# Patient Record
Sex: Male | Born: 2000 | Race: Black or African American | Marital: Single
Health system: Southern US, Community
[De-identification: ages and names within clinical notes are randomized; demographics above are authoritative.]

## PROBLEM LIST (undated history)

## (undated) DIAGNOSIS — E119 Type 2 diabetes mellitus without complications: Secondary | ICD-10-CM

## (undated) DIAGNOSIS — J45909 Unspecified asthma, uncomplicated: Secondary | ICD-10-CM

## (undated) DIAGNOSIS — R569 Unspecified convulsions: Secondary | ICD-10-CM

## (undated) DIAGNOSIS — M549 Dorsalgia, unspecified: Secondary | ICD-10-CM

## (undated) DIAGNOSIS — M419 Scoliosis, unspecified: Secondary | ICD-10-CM

## (undated) DIAGNOSIS — H539 Unspecified visual disturbance: Secondary | ICD-10-CM

## (undated) DIAGNOSIS — F840 Autistic disorder: Secondary | ICD-10-CM

## (undated) DIAGNOSIS — Q909 Down syndrome, unspecified: Secondary | ICD-10-CM

## (undated) DIAGNOSIS — R413 Other amnesia: Secondary | ICD-10-CM

## (undated) HISTORY — DX: Dorsalgia, unspecified: M54.9

## (undated) HISTORY — DX: Type 2 diabetes mellitus without complications: E11.9

## (undated) HISTORY — DX: Other amnesia: R41.3

## (undated) HISTORY — DX: Unspecified visual disturbance: H53.9

## (undated) HISTORY — DX: Unspecified convulsions: R56.9

---
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
## 2022-01-31 ENCOUNTER — Other Ambulatory Visit (INDEPENDENT_AMBULATORY_CARE_PROVIDER_SITE_OTHER): Payer: Self-pay | Admitting: Neurology

## 2022-03-18 ENCOUNTER — Ambulatory Visit (INDEPENDENT_AMBULATORY_CARE_PROVIDER_SITE_OTHER): Payer: Medicaid Other | Admitting: Neurology

## 2022-03-18 ENCOUNTER — Encounter (INDEPENDENT_AMBULATORY_CARE_PROVIDER_SITE_OTHER): Payer: Self-pay | Admitting: Neurology

## 2022-03-18 VITALS — BP 120/80 | Ht 71.26 in | Wt 247.6 lb

## 2022-03-18 DIAGNOSIS — Q753 Macrocephaly: Secondary | ICD-10-CM

## 2022-03-18 DIAGNOSIS — G40909 Epilepsy, unspecified, not intractable, without status epilepticus: Secondary | ICD-10-CM | POA: Diagnosis not present

## 2022-03-18 DIAGNOSIS — F840 Autistic disorder: Secondary | ICD-10-CM | POA: Diagnosis not present

## 2022-03-18 MED ORDER — CLOBAZAM 10 MG PO TABS: 10.0000 mg | ORAL_TABLET | Freq: Two times a day (BID) | ORAL | 5 refills | Status: DC

## 2022-03-18 MED ORDER — TOPIRAMATE 200 MG PO TABS: 200.0000 mg | ORAL_TABLET | Freq: Two times a day (BID) | ORAL | 7 refills | Status: DC

## 2022-03-18 NOTE — Progress Notes (Signed)
Patient: Stephen Shaw MRN: [id number] ?Sex: male DOB: Mar 20, 2001 ? ?Provider: Keturah Shavers, MD ?Location of Care: [facility] ? ?Note type: Routine return visit ? ?Referral Source: Dr. Ivory Broad ?History from: father and [facility] chart ?Chief Complaint: question on medications ? ?History of Present Illness: ?Stephen Shaw is a 21 y.o. male is here for follow-up management of seizure disorder. ?He has a diagnosis of autism spectrum disorder, developmental delay and intellectual disability with macrocephaly and seizure disorder, currently on Topamax and recently Onfi was added as well due to having more clinical seizure activity. ?Since he was last seen in December and Onfi was added, he has been doing very well without having any other seizure activity and has not had any emergency room visits for seizure. ?He has been tolerating both medications well with no side effects and no drowsiness during the day.  He usually sleeps well through the night.  As per father he has been having some back pain for which he is going to follow-up with his primary care physician.  Overall father is happy with his progress and do not have any other complaints or concerns from neurological point of view. ? ?Review of Systems: ?Review of system as per HPI, otherwise negative. ? ?Past Medical History:  ?Diagnosis Date  ? Asthma   ? Autism   ? Back pain   ? Down syndrome   ? Memory loss   ? Scoliosis   ? Seizures (HCC)   ? Vision abnormalities   ? ?Hospitalizations: No., Head Injury: No., Nervous System Infections: No., Immunizations up to date: Yes.   ? ? ?Surgical History ?History reviewed. No pertinent surgical history. ? ?Family History ?family history includes Heart Problems in an other family member; Seizures in an other family member. ? ? ?Social History ?Social History  ? ?Socioeconomic History  ? Marital status: Single  ?  Spouse name: Not on file  ? Number of children: Not on file  ? Years of  education: Not on file  ? Highest education level: Not on file  ?Occupational History  ? Not on file  ?Tobacco Use  ? Smoking status: Never  ?  Passive exposure: Never  ? Smokeless tobacco: Never  ?Substance and Sexual Activity  ? Alcohol use: No  ? Drug use: No  ? Sexual activity: Not on file  ?Other Topics Concern  ? Not on file  ?Social History Narrative  ? Lives with dad at home. Not in school. Dad stated he graduated.  ? Dad needs a note for land lord to change to a 1 st floor apt due to patients spine issues and seizures.  ? ?Social Determinants of Health  ? ?Financial Resource Strain: Not on file  ?Food Insecurity: Not on file  ?Transportation Needs: Not on file  ?Physical Activity: Not on file  ?Stress: Not on file  ?Social Connections: Not on file  ? ? ? ?Allergies  ?Allergen Reactions  ? Other Itching and Other (See Comments)  ?  Seasonal Allergies (pollen/grasses)- Runny nose, itchy eyes, etc.  ? ? ?Physical Exam ?BP 120/80   Ht 5' 11.26" (1.81 m)   Wt 247 lb 9.2 oz (112.3 kg)   BMI 34.28 kg/m?  ?Gen: Awake, alert, not in distress, Non-toxic appearance. ?Skin: No neurocutaneous stigmata, no rash ?HEENT: Macrocephalic with prominent forehead, no conjunctival injection, nares patent, mucous membranes moist, oropharynx clear. ?Neck: Supple, no meningismus, no lymphadenopathy,  ?Resp: Clear to auscultation bilaterally ?CV: Regular rate, normal S1/S2,  no murmurs, no rubs ?Abd: Bowel sounds present, abdomen soft, non-tender, non-distended.  No hepatosplenomegaly or mass. ?Ext: Warm and well-perfused. No deformity, no muscle wasting, ROM full. ? ?Neurological Examination: ?MS- Awake, alert, interactive, nonverbal but able to follow simple commands ?Cranial Nerves- Pupils equal, round and reactive to light (5 to 6mm); fix and follows with full and smooth EOM; no nystagmus; no ptosis, funduscopy with normal sharp discs, visual field full by looking at the toys on the side, face symmetric with smile.  Hearing  intact to bell bilaterally, palate elevation is symmetric, and tongue protrusion is symmetric. ?Tone- Normal ?Strength-Seems to have good strength, symmetrically by observation and passive movement. ?Reflexes-  ? ? Biceps Triceps Brachioradialis Patellar Ankle  ?R 2+ 2+ 2+ 2+ 2+  ?L 2+ 2+ 2+ 2+ 2+  ? ?Plantar responses flexor bilaterally, no clonus noted ?Sensation- Withdraw at four limbs to stimuli. ?Coordination- Reached to the object with no dysmetria ?Gait: Normal walk without any coordination or balance issues. ? ? ?Assessment and Plan ?1. Seizure disorder ([place])   ?2. Macrocephaly   ?3. Autism spectrum disorder   ? ?This is an almost 21 year old boy with autism spectrum disorder, macrocephaly and seizure disorder, currently on moderate dose of Topamax and Onfi with good seizure control and no recent clinical seizure activity.  He has no new findings on his neurological examination. ?Recommend to continue the same dose of Topamax at 200 mg twice daily ?Continue the same dose of Onfi at 10 mg twice daily ?Continue with adequate sleep and limited screen time ?Follow-up with his PCP for his back pain ?Father will call my office if he develops more seizure activity ?Return in 7 months for follow-up visit to adjust the dose of medication if needed.  Father understood and agreed with the plan. ? ?Meds ordered this encounter  ?Medications  ? topiramate (TOPAMAX) 200 MG tablet  ?  Sig: Take 1 tablet (200 mg total) by mouth 2 (two) times daily.  ?  Dispense:  60 tablet  ?  Refill:  7  ? cloBAZam (ONFI) 10 MG tablet  ?  Sig: Take 1 tablet (10 mg total) by mouth 2 (two) times daily.  ?  Dispense:  60 tablet  ?  Refill:  5  ? ?No orders of the defined types were placed in this encounter. ? ?

## 2022-03-18 NOTE — Patient Instructions (Signed)
Continue the same dose of Topamax and Onfi which would be 1 tablet twice daily of each ?Continue with adequate sleep ?Continue follow-up with primary care physician for his back pain ?Return in 7 months for follow-up visit ?

## 2022-03-20 ENCOUNTER — Ambulatory Visit (INDEPENDENT_AMBULATORY_CARE_PROVIDER_SITE_OTHER): Payer: Medicaid Other | Admitting: Neurology

## 2022-06-15 ENCOUNTER — Emergency Department (HOSPITAL_COMMUNITY)
Admission: EM | Admit: 2022-06-15 | Discharge: 2022-06-15 | Disposition: A | Discharge status: Home / Self Care | Payer: Medicaid Other | Attending: Emergency Medicine | Admitting: Emergency Medicine

## 2022-06-15 ENCOUNTER — Encounter (HOSPITAL_COMMUNITY): Payer: Self-pay

## 2022-06-15 DIAGNOSIS — G40909 Epilepsy, unspecified, not intractable, without status epilepticus: Secondary | ICD-10-CM | POA: Diagnosis present

## 2022-06-15 DIAGNOSIS — R569 Unspecified convulsions: Secondary | ICD-10-CM

## 2022-06-15 LAB — BASIC METABOLIC PANEL
Anion gap: 11 (ref 5–15)
BUN: 11 mg/dL (ref 6–20)
CO2: 15 mmol/L — ABNORMAL LOW (ref 22–32)
Calcium: 9.5 mg/dL (ref 8.9–10.3)
Chloride: 115 mmol/L — ABNORMAL HIGH (ref 98–111)
Creatinine, Ser: 1.35 mg/dL — ABNORMAL HIGH (ref 0.61–1.24)
GFR, Estimated: >60 mL/min (ref >60)
Glucose, Bld: 109 mg/dL — ABNORMAL HIGH (ref 70–99)
Potassium: 4 mmol/L (ref 3.5–5.1)
Sodium: 141 mmol/L (ref 135–145)

## 2022-06-15 LAB — CBC
HCT: 42.2 % (ref 39.0–52.0)
Hemoglobin: 14.5 g/dL (ref 13.0–17.0)
MCH: 30.3 pg (ref 26.0–34.0)
MCHC: 34.4 g/dL (ref 30.0–36.0)
MCV: 88.1 fL (ref 80.0–100.0)
Platelets: 318 10*3/uL (ref 150–400)
RBC: 4.79 MIL/uL (ref 4.22–5.81)
RDW: 12.4 % (ref 11.5–15.5)
WBC: 8 10*3/uL (ref 4.0–10.5)
nRBC: 0 % (ref 0.0–0.2)

## 2022-06-15 MED ORDER — ONDANSETRON HCL 4 MG/2ML IJ SOLN
4.0000 mg | Freq: Once | INTRAMUSCULAR | Status: AC
  Administered 2022-06-15: 4 mg via INTRAVENOUS
  Filled 2022-06-15: qty 2

## 2022-06-15 NOTE — ED Triage Notes (Signed)
Pt BIB [facility] from home after grandma called EMS for seizure lasting >5 mins. Patient was in bed during seizure, no fall, no injury. Patient remained postictal throughout transport, family reports patient is more postictal than usual. Patient vomited en route and EMS reports may have aspirated. Patient alert and oriented to baseline.

## 2022-06-15 NOTE — ED Provider Notes (Signed)
[facility] EMERGENCY DEPARTMENT Provider Note   CSN: [id number] Arrival date & time: 06/15/22  1110     History  Chief Complaint  Patient presents with   Seizures    Stephen Shaw is a 21 y.o. male.  HPI 21 year old male history of known seizure disorder reportedly had seizure this morning.  Father is the historian.  He states that his mother was there.  He had sudden movement of his right hand and then had a grand mal seizure.  Seizure lasted for at least several minutes and patient has been postictal.  No report of missed medications.  Patient has no complaints at this time     Home Medications Prior to Admission medications   Medication Sig Start Date End Date Taking? Authorizing Provider  albuterol (PROVENTIL) (2.5 MG/3ML) 0.083% nebulizer solution Take 2.5 mg by nebulization every 6 (six) hours as needed for wheezing or shortness of breath. For shortness of breath/wheezing.   Yes [provider]  budesonide (PULMICORT) 1 MG/2ML nebulizer solution Take 1 mg by nebulization daily.   Yes [provider]  clindamycin-benzoyl peroxide (BENZACLIN) gel Apply 1 application topically See admin instructions. Apply to affected areas of the face as directed in the evening/wash face 2 times a day 03/16/18  Yes [provider]  cloBAZam (ONFI) 10 MG tablet Take 1 tablet (10 mg total) by mouth 2 (two) times daily. 03/18/22  Yes Keturah Shavers, MD  fluticasone [facility]) 50 MCG/ACT nasal spray Place 2 sprays into both nostrils in the morning.   Yes [provider]  HEALTHYLAX 17 g packet Take 34 g by mouth every 3 (three) days.   Yes [provider]  hydrOXYzine (ATARAX) 10 MG/5ML syrup Take 30 mg by mouth at bedtime. 03/16/18  Yes [provider]  montelukast (SINGULAIR) 5 MG chewable tablet Chew 10 mg by mouth in the morning.   Yes [provider]  naproxen (NAPROSYN) 375 MG tablet Take 375 mg by mouth 2  (two) times daily as needed (for pain). 07/23/21  Yes [provider]  NAYZILAM 5 MG/0.1ML SOLN Apply 5 mg nasally for seizures lasting longer than 5 minutes 11/01/21  Yes Keturah Shavers, MD  [facility] HFA [id number] [phone] Base) MCG/ACT inhaler Inhale 2 puffs into the lungs every 4 (four) hours as needed for wheezing or shortness of breath.   Yes [provider]  SYMBICORT 160-4.5 MCG/ACT inhaler Inhale 2 puffs into the lungs in the morning. 03/16/18  Yes [provider]  topiramate (TOPAMAX) 200 MG tablet Take 1 tablet (200 mg total) by mouth 2 (two) times daily. 03/18/22  Yes Keturah Shavers, MD  VITAMIN D3 SUPER STRENGTH 50 MCG (2000 UT) CAPS Take 2,000 Units by mouth daily.   Yes [provider]  diazepam (DIASTAT ACUDIAL) 10 MG GEL Place 17.5 mg rectally once. Patient not taking: Reported on 08/20/2021 05/12/17 08/20/21  Kirby Crigler, MD  ondansetron (ZOFRAN ODT) 4 MG disintegrating tablet Take 1 tablet (4 mg total) by mouth every 8 (eight) hours as needed for nausea or vomiting. Patient not taking: Reported on 08/20/2021 06/23/18   Juliette Alcide, MD      Allergies    Other    Review of Systems   Review of Systems  Physical Exam Updated Vital Signs BP 133/74   Pulse 88   Resp 17   SpO2 100%  Physical Exam Vitals and nursing note reviewed.  Constitutional:      Appearance: Normal appearance.  HENT:     Head: Normocephalic.     Right Ear: External ear normal.     Left Ear: External ear normal.     Nose: Nose normal.     Mouth/Throat:     Pharynx: Oropharynx is clear.  Eyes:     Pupils: Pupils are equal, round, and reactive to light.  Cardiovascular:     Rate and Rhythm: Normal rate and regular rhythm.     Pulses: Normal pulses.  Pulmonary:     Effort: Pulmonary effort is normal.  Abdominal:     General: Bowel sounds are normal.  Musculoskeletal:        General: Normal range of motion.     Cervical back: Normal range of motion.  Skin:    General:  Skin is warm.     Capillary Refill: Capillary refill takes less than 2 seconds.  Neurological:     General: No focal deficit present.     Mental Status: He is alert.  Psychiatric:        Mood and Affect: Mood normal.     ED Results / Procedures / Treatments   Labs (all labs ordered are listed, but only abnormal results are displayed) Labs Reviewed  BASIC METABOLIC PANEL - Abnormal; Notable for the following components:      Result Value   Chloride 115 (*)    CO2 15 (*)    Glucose, Bld 109 (*)    Creatinine, Ser 1.35 (*)    All other components within normal limits  CBC    EKG None  Radiology No results found.  Procedures Procedures    Medications Ordered in ED Medications  ondansetron (ZOFRAN) injection 4 mg (4 mg Intravenous Given 06/15/22 1209)    ED Course/ Medical Decision Making/ A&P Clinical Course as of 06/15/22 1508  Sat Jun 15, 2022  1502 CBC reviewed interpreted within normal limits [DR]    Clinical Course User Index [DR] Margarita Grizzle, MD                           Medical Decision Making 21 year old male known seizure disorder presents today with seizure.  Per family report patient has been taking medication.  Patient was initially postictal but now appears to be back to baseline. Patient's father is with him. I have discussed necessity of taking medications as prescribed We discussed need for follow-up with neurologist We have discussed return precautions and they voiced understanding  Amount and/or Complexity of Data Reviewed Labs: ordered. Decision-making details documented in ED Course.  Risk Prescription drug management.           Final Clinical Impression(s) / ED Diagnoses Final diagnoses:  Seizure [facility])    Rx / DC Orders ED Discharge Orders     None         Margarita Grizzle, MD 06/15/22 [phone]

## 2022-06-15 NOTE — ED Notes (Signed)
Pt verbalized understanding of d/c instructions, meds, and followup care. Denies questions. VSS, no distress noted. Steady gait to exit with all belongings.  ?

## 2022-06-15 NOTE — Discharge Instructions (Addendum)
Please take all your seizure medications as prescribed Please call your neurologist on Monday for follow-up Per [facility] statutes, patients with seizures are not allowed to drive until they have been seizure-free for six months. Use caution when using heavy equipment or power tools. Avoid working on ladders or at heights. Take showers instead of baths. Ensure the water temperature is not too high on the home water heater. Do not go swimming alone. Do not lock yourself in a room alone (i.e. bathroom). When caring for infants or small children, sit down when holding, feeding, or changing them to minimize risk of injury to the child in the event you have a seizure. Maintain good sleep hygiene. Avoid alcohol.    If Stephen Shaw has another seizure, call 911 and bring them back to the ED if:       A.  The seizure lasts longer than 5 minutes.            B.  The patient doesn't wake shortly after the seizure or has new problems such as difficulty seeing, speaking or moving following the seizure       C.  The patient was injured during the seizure       D.  The patient has a temperature over 102 F (39C)       E.  The patient vomited during the seizure and now is having trouble breathing

## 2022-08-03 ENCOUNTER — Encounter (HOSPITAL_COMMUNITY): Payer: Self-pay | Admitting: *Deleted

## 2022-08-03 ENCOUNTER — Emergency Department (HOSPITAL_COMMUNITY): Payer: Medicaid Other

## 2022-08-03 ENCOUNTER — Other Ambulatory Visit: Payer: Self-pay

## 2022-08-03 ENCOUNTER — Emergency Department (HOSPITAL_COMMUNITY)
Admission: EM | Admit: 2022-08-03 | Discharge: 2022-08-03 | Disposition: A | Discharge status: Home / Self Care | Payer: Medicaid Other | Attending: Emergency Medicine | Admitting: Emergency Medicine

## 2022-08-03 DIAGNOSIS — R569 Unspecified convulsions: Secondary | ICD-10-CM | POA: Diagnosis present

## 2022-08-03 DIAGNOSIS — J45909 Unspecified asthma, uncomplicated: Secondary | ICD-10-CM | POA: Diagnosis not present

## 2022-08-03 LAB — I-STAT CHEM 8, ED
BUN: 11 mg/dL (ref 6–20)
Calcium, Ion: 1.17 mmol/L (ref 1.15–1.40)
Chloride: 112 mmol/L — ABNORMAL HIGH (ref 98–111)
Creatinine, Ser: 1 mg/dL (ref 0.61–1.24)
Glucose, Bld: 99 mg/dL (ref 70–99)
HCT: 39 % (ref 39.0–52.0)
Hemoglobin: 13.3 g/dL (ref 13.0–17.0)
Potassium: 3.3 mmol/L — ABNORMAL LOW (ref 3.5–5.1)
Sodium: 144 mmol/L (ref 135–145)
TCO2: 18 mmol/L — ABNORMAL LOW (ref 22–32)

## 2022-08-03 LAB — CBC
HCT: 44.1 % (ref 39.0–52.0)
Hemoglobin: 14.9 g/dL (ref 13.0–17.0)
MCH: 30.7 pg (ref 26.0–34.0)
MCHC: 33.8 g/dL (ref 30.0–36.0)
MCV: 90.9 fL (ref 80.0–100.0)
Platelets: 313 10*3/uL (ref 150–400)
RBC: 4.85 MIL/uL (ref 4.22–5.81)
RDW: 12.9 % (ref 11.5–15.5)
WBC: 8.5 10*3/uL (ref 4.0–10.5)
nRBC: 0 % (ref 0.0–0.2)

## 2022-08-03 LAB — COMPREHENSIVE METABOLIC PANEL
ALT: 32 U/L (ref 0–44)
AST: 34 U/L (ref 15–41)
Albumin: 4.4 g/dL (ref 3.5–5.0)
Alkaline Phosphatase: 82 U/L (ref 38–126)
Anion gap: 17 — ABNORMAL HIGH (ref 5–15)
BUN: 12 mg/dL (ref 6–20)
CO2: 12 mmol/L — ABNORMAL LOW (ref 22–32)
Calcium: 9.6 mg/dL (ref 8.9–10.3)
Chloride: 111 mmol/L (ref 98–111)
Creatinine, Ser: 1.38 mg/dL — ABNORMAL HIGH (ref 0.61–1.24)
GFR, Estimated: >60 mL/min (ref >60)
Glucose, Bld: 104 mg/dL — ABNORMAL HIGH (ref 70–99)
Potassium: 4 mmol/L (ref 3.5–5.1)
Sodium: 140 mmol/L (ref 135–145)
Total Bilirubin: 0.2 mg/dL — ABNORMAL LOW (ref 0.3–1.2)
Total Protein: 7.6 g/dL (ref 6.5–8.1)

## 2022-08-03 LAB — LACTIC ACID, PLASMA: Lactic Acid, Venous: 8.7 mmol/L (ref 0.5–1.9)

## 2022-08-03 MED ORDER — LACTATED RINGERS IV BOLUS
1000.0000 mL | Freq: Once | INTRAVENOUS | Status: AC
  Administered 2022-08-03: 1000 mL via INTRAVENOUS

## 2022-08-03 MED ORDER — SODIUM CHLORIDE 0.9 % IV BOLUS
1000.0000 mL | Freq: Once | INTRAVENOUS | Status: AC
  Administered 2022-08-03: 1000 mL via INTRAVENOUS

## 2022-08-03 NOTE — ED Provider Notes (Signed)
[facility]-EMERGENCY DEPT [facility] Emergency Department Provider Note MRN:  [id number]  Arrival date & time: 08/03/22     Chief Complaint   Seizures   History of Present Illness   Stephen Shaw is a 21 y.o. year-old male with a history of seizure disorder, Down syndrome, autism presenting to the ED with chief complaint of seizures.  Seizure this evening at about 2 AM, witnessed by father.  Tonic-clonic activity.  Denies any trauma.  A bit slow to return to baseline, had some vomiting when waking up which is abnormal.  Review of Systems  A thorough review of systems was obtained and all systems are negative except as noted in the HPI and PMH.   Patient's Health History    Past Medical History:  Diagnosis Date   Asthma    Autism    Back pain    Down syndrome    Memory loss    Scoliosis    Seizures (HCC)    Vision abnormalities     History reviewed. No pertinent surgical history.  Family History  Problem Relation Age of Onset   Heart Problems Other    Seizures Other     Social History   Socioeconomic History   Marital status: Single    Spouse name: Not on file   Number of children: Not on file   Years of education: Not on file   Highest education level: Not on file  Occupational History   Not on file  Tobacco Use   Smoking status: Never    Passive exposure: Never   Smokeless tobacco: Never  Substance and Sexual Activity   Alcohol use: No   Drug use: No   Sexual activity: Not on file  Other Topics Concern   Not on file  Social History Narrative   Lives with dad at home. Not in school. Dad stated he graduated.   Dad needs a note for land lord to change to a 1 st floor apt due to patients spine issues and seizures.   Social Determinants of Health   Financial Resource Strain: Not on file  Food Insecurity: Not on file  Transportation Needs: Not on file  Physical Activity: Not on file  Stress: Not on file  Social Connections: Not on file   Intimate Partner Violence: Not on file     Physical Exam   Vitals:   08/03/22 0600 08/03/22 0624  BP: 133/78   Pulse: 88   Resp: 20   Temp:  98 F (36.7 C)  SpO2: 100%     CONSTITUTIONAL: Ill-appearing, NAD NEURO/PSYCH: Alert, oriented to name, moves all extremities equally, follows commands EYES:  eyes equal and reactive ENT/NECK:  no LAD, no JVD CARDIO: Regular rate, well-perfused, normal S1 and S2 PULM:  CTAB no wheezing or rhonchi GI/GU:  non-distended, non-tender MSK/SPINE:  No gross deformities, no edema SKIN:  no rash, atraumatic   *Additional and/or pertinent findings included in MDM below  Diagnostic and Interventional Summary    EKG Interpretation  Date/Time:  Saturday August 03 2022 03:22:26 EDT Ventricular Rate:  108 PR Interval:  138 QRS Duration: 111 QT Interval:  342 QTC Calculation: 459 R Axis:   29 Text Interpretation: Sinus tachycardia Consider right ventricular hypertrophy Borderline ST elevation, anterolateral leads Confirmed by Kennis Carina [phone]) on 08/03/2022 4:03:23 AM       Labs Reviewed  COMPREHENSIVE METABOLIC PANEL - Abnormal; Notable for the following components:      Result Value   CO2 12 (*)  Glucose, Bld 104 (*)    Creatinine, Ser 1.38 (*)    Total Bilirubin 0.2 (*)    Anion gap 17 (*)    All other components within normal limits  LACTIC ACID, PLASMA - Abnormal; Notable for the following components:   Lactic Acid, Venous 8.7 (*)    All other components within normal limits  I-STAT CHEM 8, ED - Abnormal; Notable for the following components:   Potassium 3.3 (*)    Chloride 112 (*)    TCO2 18 (*)    All other components within normal limits  CBC    CT HEAD WO CONTRAST ( )  Final Result      Medications  sodium chloride 0.9 % bolus 1,000 mL (0 mLs Intravenous Stopped 08/03/22 0604)  lactated ringers bolus 1,000 mL (0 mLs Intravenous Stopped 08/03/22 0610)     Procedures  /  Critical Care Procedures  ED Course  and Medical Decision Making  Initial Impression and Ddx Seizure disorder presenting with seizure, some atypical features of the seizure, slow to return to baseline and having some vomiting.  Awaiting labs, will obtain CT to exclude bleeding, mass lesion, elevated ICP.  Past medical/surgical history that increases complexity of ED encounter: Seizure disorder  Interpretation of Diagnostics I personally reviewed the EKG and my interpretation is as follows: Sinus rhythm  Labs reveal prominent lactic acidosis, anticipated given the report of seizure.  Patient Reassessment and Ultimate Disposition/Management     Patient is back to baseline, doing well clinically.  Provided with fluids and labs rechecked with significant improvement in acidosis.  Appropriate for discharge.  Patient management required discussion with the following services or consulting groups:  None  Complexity of Problems Addressed Acute illness or injury that poses threat of life of bodily function  Additional Data Reviewed and Analyzed Further history obtained from: Further history from spouse/family member  Additional Factors Impacting ED Encounter Risk None  Elmer Sow. Pilar Plate, MD [facility] Emergency Medicine [facility] [email or web address]  Final Clinical Impressions(s) / ED Diagnoses     ICD-10-CM   1. Seizure (HCC)  R56.9       ED Discharge Orders     None        Discharge Instructions Discussed with and Provided to Patient:     Discharge Instructions      You were evaluated in the Emergency Department and after careful evaluation, we did not find any emergent condition requiring admission or further testing in the hospital.  Your exam/testing today is overall reassuring.  Recommend close follow-up with your neurologist.  Please return to the Emergency Department if you experience any worsening of your condition.   Thank you for allowing [place] to be a part of your care.        Sabas Sous, MD 08/03/22 0630

## 2022-08-03 NOTE — ED Notes (Signed)
Pt father and pt is requesting something to drink for pt. Sent message to provider reference same

## 2022-08-03 NOTE — ED Notes (Signed)
Provided pt with drink at this time. Remains A&O to baseline. Denies any symptoms by pt or father

## 2022-08-03 NOTE — ED Triage Notes (Signed)
Pt arrived with [facility], accompanied by father, for seizures. Hx of same. No recent medication changes, last seizure was in July. While getting checked in, pt vomited ~573ml, denies any pain. Hx of autism. 18g L AC

## 2022-08-03 NOTE — Discharge Instructions (Signed)
You were evaluated in the Emergency Department and after careful evaluation, we did not find any emergent condition requiring admission or further testing in the hospital.  Your exam/testing today is overall reassuring.  Recommend close follow-up with your neurologist.  Please return to the Emergency Department if you experience any worsening of your condition.   Thank you for allowing [place] to be a part of your care.

## 2022-09-23 ENCOUNTER — Other Ambulatory Visit (INDEPENDENT_AMBULATORY_CARE_PROVIDER_SITE_OTHER): Payer: Self-pay | Admitting: Neurology

## 2022-09-23 DIAGNOSIS — G40909 Epilepsy, unspecified, not intractable, without status epilepticus: Secondary | ICD-10-CM

## 2022-09-23 NOTE — Telephone Encounter (Signed)
Seen 03/18/2022 next appt 10/21/22

## 2022-10-21 ENCOUNTER — Ambulatory Visit (INDEPENDENT_AMBULATORY_CARE_PROVIDER_SITE_OTHER): Payer: Medicaid Other | Admitting: Neurology

## 2022-10-21 ENCOUNTER — Encounter (INDEPENDENT_AMBULATORY_CARE_PROVIDER_SITE_OTHER): Payer: Self-pay | Admitting: Neurology

## 2022-10-21 VITALS — BP 122/70 | HR 76 | Ht 71.77 in | Wt 235.9 lb

## 2022-10-21 DIAGNOSIS — R6250 Unspecified lack of expected normal physiological development in childhood: Secondary | ICD-10-CM | POA: Diagnosis not present

## 2022-10-21 DIAGNOSIS — G40909 Epilepsy, unspecified, not intractable, without status epilepticus: Secondary | ICD-10-CM | POA: Diagnosis not present

## 2022-10-21 DIAGNOSIS — Q753 Macrocephaly: Secondary | ICD-10-CM

## 2022-10-21 DIAGNOSIS — F840 Autistic disorder: Secondary | ICD-10-CM

## 2022-10-21 DIAGNOSIS — F819 Developmental disorder of scholastic skills, unspecified: Secondary | ICD-10-CM

## 2022-10-21 MED ORDER — CLOBAZAM 10 MG PO TABS: ORAL_TABLET | ORAL | 5 refills | Status: DC

## 2022-10-21 MED ORDER — TOPIRAMATE 200 MG PO TABS: ORAL_TABLET | ORAL | 7 refills | Status: DC

## 2022-10-21 NOTE — Patient Instructions (Addendum)
Since he has had 2 seizure activity, we will increase the dose of medications Continue Topamax at 1 tablet in a.m. and 1 and half tablet in p.m. Continue Onfi at 1 tablet in a.m. and 1 and half tablet in p.m. Continue with adequate sleep Call my office if there are more seizure activity Try to do some video recording of seizure activity if possible Return in 7 months for follow-up visit

## 2022-10-21 NOTE — Progress Notes (Signed)
Patient: Stephen Shaw MRN: [id number] Sex: male DOB: Oct 29, 2001  Provider: Keturah Shavers, MD Location of Care: [facility] Child Neurology  Note type: Routine return visit  Referral SourceCoccaro, Althea Grimmer, MD  History from: patient and referring office Chief Complaint: Seizure disorder [facility])   History of Present Illness: Stephen Shaw is a 21 y.o. male is here for follow-up management of seizure disorder. He has diagnosis of autism spectrum disorder with developmental delay, intellectual disability, macrocephaly and seizure disorder, currently on 2 AEDs including Topamax and Onfi with moderate dose. He was last seen in April when he was doing fairly well and recommended to continue the same dose of medications but since then he has had 2 episodes of major clinical seizure activity for which he went to the emergency room in July and then in September. Otherwise he has been doing well and has been taking his medication regularly without any missing doses.  He usually sleeps well without any difficulty and with no awakening.  He has no significant behavioral changes or mood issues and father does not have any other complaints or concerns at this time.  Review of Systems: Review of system as per HPI, otherwise negative.  Past Medical History:  Diagnosis Date   Asthma    Autism    Back pain    Down syndrome    Memory loss    Scoliosis    Seizures (HCC)    Vision abnormalities    Hospitalizations: No., Head Injury: No., Nervous System Infections: No., Immunizations up to date: Yes.     Surgical History History reviewed. No pertinent surgical history.  Family History family history includes Heart Problems in an other family member; Seizures in an other family member.  Social History Social History   Socioeconomic History   Marital status: Single    Spouse name: Not on file   Number of children: Not on file   Years of education: Not on file   Highest  education level: Not on file  Occupational History   Not on file  Tobacco Use   Smoking status: Never    Passive exposure: Never   Smokeless tobacco: Never  Substance and Sexual Activity   Alcohol use: No   Drug use: No   Sexual activity: Not on file  Other Topics Concern   Not on file  Social History Narrative   Lives with dad at home. Not in school. Dad stated he graduated.   Dad needs a note for land lord to change to a 1 st floor apt due to patients spine issues and seizures.   Social Determinants of Health   Financial Resource Strain: Not on file  Food Insecurity: Not on file  Transportation Needs: Not on file  Physical Activity: Not on file  Stress: Not on file  Social Connections: Not on file     Allergies  Allergen Reactions   Other Itching and Other (See Comments)    Seasonal Allergies (pollen/grasses)- Runny nose, itchy eyes, etc.    Physical Exam BP 122/70   Pulse 76   Ht 5' 11.77" (1.823 m)   Wt 235 lb 14.3 oz (107 kg)   BMI 32.20 kg/m  Gen: Awake, alert, not in distress,  Skin: No neurocutaneous stigmata, no rash HEENT: Macrocephalic with prominent forehead, no conjunctival injection, nares patent, mucous membranes moist, oropharynx clear. Neck: Supple, no meningismus, no lymphadenopathy,  Resp: Clear to auscultation bilaterally CV: Regular rate, normal S1/S2, no murmurs, no rubs Abd: Bowel sounds  present, abdomen soft, non-tender, non-distended.  No hepatosplenomegaly or mass. Ext: Warm and well-perfused. No deformity, no muscle wasting, ROM full.  Neurological Examination: MS- Awake, decreased eye contact and nonverbal Cranial Nerves- Pupils equal, round and reactive to light (5 to 59mm); fix and follows with full and smooth EOM; no nystagmus; no ptosis, funduscopy with normal sharp discs, visual field full by looking at the toys on the side, face symmetric with smile.  Hearing intact to bell bilaterally, palate elevation is symmetric,  Tone-  Normal Strength-Seems to have good strength, symmetrically by observation and passive movement. Reflexes-    Biceps Triceps Brachioradialis Patellar Ankle  R 2+ 2+ 2+ 2+ 2+  L 2+ 2+ 2+ 2+ 2+   Plantar responses flexor bilaterally, no clonus noted Sensation- Withdraw at four limbs to stimuli. Coordination- Reached to the object with no dysmetria Gait: Normal walk without any coordination or balance issues.   Assessment and Plan 1. Seizure disorder ([place])   2. Macrocephaly   3. Autism spectrum disorder    This is a 21 year old male with diagnosis of autism spectrum disorder, developmental delay, intellectual disability, macrocephaly and seizure disorder, currently on 2 AEDs although he has had at least 2 major clinical seizure activity over the past 6 months.  He has no new findings on his neurological examination. Recommend to slightly increase the dose of Topamax to 200 mg in the morning and 300 mg in the evening Also we will increase the dose of Onfi to 10 mg in the morning and 50 mg in the evening Based on his weight both of these medications would be moderate dose of medication. He will continue with adequate sleep and limited screen time He does have nasal spray in case of prolonged seizure activity I asked father to try to do some video recording of these episodes if possible I would like to see him in 7 months for follow-up visit for reevaluation.  Father understood and agreed with the plan.   Meds ordered this encounter  Medications   topiramate (TOPAMAX) 200 MG tablet    Sig: Take 1 tablet or 200 mg in a.m. and 1.5 tablets or 300 mg in p.m.    Dispense:  75 tablet    Refill:  7   cloBAZam (ONFI) 10 MG tablet    Sig: Take 1 tablet or 10 mg in a.m. and 1.5 tablet or 15 mg in p.m.    Dispense:  75 tablet    Refill:  5   No orders of the defined types were placed in this encounter.

## 2023-04-03 ENCOUNTER — Other Ambulatory Visit (INDEPENDENT_AMBULATORY_CARE_PROVIDER_SITE_OTHER): Payer: Self-pay | Admitting: Neurology

## 2023-04-03 DIAGNOSIS — G40909 Epilepsy, unspecified, not intractable, without status epilepticus: Secondary | ICD-10-CM

## 2023-04-03 NOTE — Telephone Encounter (Signed)
Last OV 10/21/22 Next OV 05/22/23 Rx written 10/21/22 with 5 RF

## 2023-04-29 ENCOUNTER — Telehealth (INDEPENDENT_AMBULATORY_CARE_PROVIDER_SITE_OTHER): Payer: Self-pay | Admitting: Neurology

## 2023-04-29 MED ORDER — TOPIRAMATE 200 MG PO TABS
ORAL_TABLET | ORAL | 7 refills | Status: DC
Start: 2023-04-29 — End: 2023-05-22

## 2023-04-29 NOTE — Telephone Encounter (Signed)
Who's calling (name and relationship to patient) : Barbaraann Cao; dad   Best contact number: [phone]  Provider they see: Dr. Merri Brunette  Reason for call: Dad has called in stating that [facility] Sacks has ran out of meds for Topamax. Dad stated that he was getting 6, but he was taken down to 75. Dad stated this is the second time that he has ran out of meds. Dad has requested a call back.   Call ID:      PRESCRIPTION REFILL ONLY  Name of prescription:  Pharmacy:

## 2023-04-29 NOTE — Telephone Encounter (Signed)
Returned call to dad. He states the Topomax Rx "should be a quantity of 90 and not 75 and this is why he keeps running out".   After reviewing last note/office visit (10-21-2022) I see 1 in AM and 1 & 1/2 in PM. Dad states that you both talked about doing 1 & 1/2 in AM and 1 % 1/2 in PM.  Can you advise?  Lucille Passy CMA

## 2023-04-29 NOTE — Telephone Encounter (Signed)
Dad is stating that you cut his medication down and states he was told to give him 1.5 morning and night. (200 mg) and he said the same for the smaller one also. He is asking can you re write the RX for the pharmacy so that way it can be filled accordingly. Dad doesn't want him to run out.

## 2023-04-30 NOTE — Telephone Encounter (Signed)
Returned call to dad.  Rx sent is (Topomax) with Cloretta Ned: 90 with updated directions and refills.  Confirmed with pharmacy.  B. Roten CMA

## 2023-04-30 NOTE — Telephone Encounter (Signed)
Dad called back in stating that the quantity of 75 is not enough and he needs to go back up to 90 due to [employer] running out.

## 2023-04-30 NOTE — Telephone Encounter (Signed)
Notified Father/Guardian.  B. Roten CMA

## 2023-05-21 NOTE — Progress Notes (Unsigned)
Patient: Stephen Shaw MRN: [id number] Sex: male DOB: [birth year]  Provider: Keturah Shavers, MD Location of Care: [facility]  Note type: {CN NOTE WUJWJ:[id number]}  Referral Source: Christel Mormon, MD History from: {CN REFERRED AO:[id number]} Chief Complaint: Follow up Seizures, Macrocephaly & Autism  History of Present Illness:  Stephen Shaw is a 22 y.o. male ***.  Review of Systems: Review of system as per HPI, otherwise negative.  Past Medical History:  Diagnosis Date   Asthma    Autism    Back pain    Down syndrome    Memory loss    Scoliosis    Seizures (HCC)    Vision abnormalities    Hospitalizations: {yes no:314532}, Head Injury: {yes no:314532}, Nervous System Infections: {yes no:314532}, Immunizations up to date: {yes no:314532}  Birth History ***  Surgical History No past surgical history on file.  Family History family history includes Heart Problems in an other family member; Seizures in an other family member. Family History is negative for ***.  Social History Social History   Socioeconomic History   Marital status: Single    Spouse name: Not on file   Number of children: Not on file   Years of education: Not on file   Highest education level: Not on file  Occupational History   Not on file  Tobacco Use   Smoking status: Never    Passive exposure: Never   Smokeless tobacco: Never  Substance and Sexual Activity   Alcohol use: No   Drug use: No   Sexual activity: Not on file  Other Topics Concern   Not on file  Social History Narrative   Lives with dad at home. Not in school. Dad stated he graduated.   Dad needs a note for land lord to change to a 1 st floor apt due to patients spine issues and seizures.   Social Determinants of Health   Financial Resource Strain: Not on file  Food Insecurity: Not on file  Transportation Needs: Not on file  Physical Activity: Not on file  Stress: Not on file   Social Connections: Not on file     Allergies  Allergen Reactions   Other Itching and Other (See Comments)    Seasonal Allergies (pollen/grasses)- Runny nose, itchy eyes, etc.    Physical Exam There were no vitals taken for this visit. ***  Assessment and Plan ***  No orders of the defined types were placed in this encounter.  No orders of the defined types were placed in this encounter.

## 2023-05-22 ENCOUNTER — Ambulatory Visit (INDEPENDENT_AMBULATORY_CARE_PROVIDER_SITE_OTHER): Payer: Medicaid Other | Admitting: Neurology

## 2023-05-22 ENCOUNTER — Encounter (INDEPENDENT_AMBULATORY_CARE_PROVIDER_SITE_OTHER): Payer: Self-pay | Admitting: Neurology

## 2023-05-22 VITALS — BP 108/70 | HR 80 | Ht 71.85 in | Wt 222.4 lb

## 2023-05-22 DIAGNOSIS — G40909 Epilepsy, unspecified, not intractable, without status epilepticus: Secondary | ICD-10-CM

## 2023-05-22 DIAGNOSIS — F840 Autistic disorder: Secondary | ICD-10-CM

## 2023-05-22 DIAGNOSIS — Q753 Macrocephaly: Secondary | ICD-10-CM | POA: Diagnosis not present

## 2023-05-22 MED ORDER — CLOBAZAM 10 MG PO TABS: ORAL_TABLET | ORAL | 5 refills | Status: DC

## 2023-05-22 MED ORDER — TOPIRAMATE 200 MG PO TABS: ORAL_TABLET | ORAL | 7 refills | Status: DC

## 2023-05-22 NOTE — Patient Instructions (Signed)
Continue the same dose of Topamax at 1.5 tablet twice daily Continue the same dose of Onfi at 1.5 tablet twice daily Continue with adequate sleep Have more hydration Call my office if there is any seizure Return in 7 months for follow-up with

## 2023-07-11 ENCOUNTER — Emergency Department (HOSPITAL_COMMUNITY): Payer: MEDICAID

## 2023-07-11 ENCOUNTER — Emergency Department (HOSPITAL_COMMUNITY)
Admission: EM | Admit: 2023-07-11 | Discharge: 2023-07-11 | Disposition: A | Discharge status: Home / Self Care | Payer: MEDICAID | Attending: Emergency Medicine | Admitting: Emergency Medicine

## 2023-07-11 ENCOUNTER — Encounter (HOSPITAL_COMMUNITY): Payer: Self-pay | Admitting: Emergency Medicine

## 2023-07-11 ENCOUNTER — Other Ambulatory Visit: Payer: Self-pay

## 2023-07-11 DIAGNOSIS — R569 Unspecified convulsions: Secondary | ICD-10-CM | POA: Diagnosis present

## 2023-07-11 DIAGNOSIS — E1110 Type 2 diabetes mellitus with ketoacidosis without coma: Secondary | ICD-10-CM | POA: Diagnosis not present

## 2023-07-11 DIAGNOSIS — F840 Autistic disorder: Secondary | ICD-10-CM | POA: Diagnosis not present

## 2023-07-11 DIAGNOSIS — Z7984 Long term (current) use of oral hypoglycemic drugs: Secondary | ICD-10-CM | POA: Diagnosis not present

## 2023-07-11 DIAGNOSIS — Z79899 Other long term (current) drug therapy: Secondary | ICD-10-CM | POA: Insufficient documentation

## 2023-07-11 LAB — BASIC METABOLIC PANEL
Anion gap: 11 (ref 5–15)
BUN: 10 mg/dL (ref 6–20)
CO2: 20 mmol/L — ABNORMAL LOW (ref 22–32)
Calcium: 8.7 mg/dL — ABNORMAL LOW (ref 8.9–10.3)
Chloride: 108 mmol/L (ref 98–111)
Creatinine, Ser: 1.04 mg/dL (ref 0.61–1.24)
GFR, Estimated: >60 mL/min (ref >60)
Glucose, Bld: 102 mg/dL — ABNORMAL HIGH (ref 70–99)
Potassium: 3.2 mmol/L — ABNORMAL LOW (ref 3.5–5.1)
Sodium: 139 mmol/L (ref 135–145)

## 2023-07-11 LAB — ETHANOL: Alcohol, Ethyl (B): <10 mg/dL (ref <10)

## 2023-07-11 LAB — COMPREHENSIVE METABOLIC PANEL
ALT: 24 U/L (ref 0–44)
AST: 26 U/L (ref 15–41)
Albumin: 4.3 g/dL (ref 3.5–5.0)
Alkaline Phosphatase: 69 U/L (ref 38–126)
Anion gap: 17 — ABNORMAL HIGH (ref 5–15)
BUN: 10 mg/dL (ref 6–20)
CO2: 13 mmol/L — ABNORMAL LOW (ref 22–32)
Calcium: 9.5 mg/dL (ref 8.9–10.3)
Chloride: 107 mmol/L (ref 98–111)
Creatinine, Ser: 1.22 mg/dL (ref 0.61–1.24)
GFR, Estimated: >60 mL/min (ref >60)
Glucose, Bld: 105 mg/dL — ABNORMAL HIGH (ref 70–99)
Potassium: 3.8 mmol/L (ref 3.5–5.1)
Sodium: 137 mmol/L (ref 135–145)
Total Bilirubin: <0.1 mg/dL — ABNORMAL LOW (ref 0.3–1.2)
Total Protein: 7.8 g/dL (ref 6.5–8.1)

## 2023-07-11 LAB — CBC WITH DIFFERENTIAL/PLATELET
Abs Immature Granulocytes: 0.09 10*3/uL — ABNORMAL HIGH (ref 0.00–0.07)
Basophils Absolute: 0.1 10*3/uL (ref 0.0–0.1)
Basophils Relative: 1 %
Eosinophils Absolute: 0.2 10*3/uL (ref 0.0–0.5)
Eosinophils Relative: 2 %
HCT: 45 % (ref 39.0–52.0)
Hemoglobin: 14.8 g/dL (ref 13.0–17.0)
Immature Granulocytes: 1 %
Lymphocytes Relative: 48 %
Lymphs Abs: 6 10*3/uL — ABNORMAL HIGH (ref 0.7–4.0)
MCH: 29.6 pg (ref 26.0–34.0)
MCHC: 32.9 g/dL (ref 30.0–36.0)
MCV: 90 fL (ref 80.0–100.0)
Monocytes Absolute: 1.1 10*3/uL — ABNORMAL HIGH (ref 0.1–1.0)
Monocytes Relative: 9 %
Neutro Abs: 4.7 10*3/uL (ref 1.7–7.7)
Neutrophils Relative %: 39 %
Platelets: 315 10*3/uL (ref 150–400)
RBC: 5 MIL/uL (ref 4.22–5.81)
RDW: 12.8 % (ref 11.5–15.5)
WBC: 12.1 10*3/uL — ABNORMAL HIGH (ref 4.0–10.5)
nRBC: 0 % (ref 0.0–0.2)

## 2023-07-11 LAB — CBG MONITORING, ED: Glucose-Capillary: 99 mg/dL (ref 70–99)

## 2023-07-11 MED ORDER — POTASSIUM CHLORIDE CRYS ER 20 MEQ PO TBCR
40.0000 meq | EXTENDED_RELEASE_TABLET | Freq: Once | ORAL | Status: AC
  Administered 2023-07-11: 40 meq via ORAL
  Filled 2023-07-11: qty 2

## 2023-07-11 MED ORDER — LACTATED RINGERS IV BOLUS
1000.0000 mL | Freq: Once | INTRAVENOUS | Status: AC
  Administered 2023-07-11: 1000 mL via INTRAVENOUS

## 2023-07-11 NOTE — ED Provider Notes (Signed)
[place] EMERGENCY DEPARTMENT AT [facility] Provider Note   CSN: [id number] Arrival date & time: 07/11/23  0145     History  Chief Complaint  Patient presents with   Seizures    Stephen Shaw is a 22 y.o. male.  Patient presents after seizure.  Does have a history of seizure disorder, autism, Down syndrome, diabetes.  Father witnessed tonic-clonic activity in bed tonight.  Lasted about 2 to 3 minutes and resolved without emergent medications.  Back to baseline now.  Having some vomiting which is not a typical after seizures.  Last seizure was approximately September 2023.  Father states compliance with seizure medications which include clobazam and Topamax.  No recent medication changes.  No fevers, chills, nausea or vomiting prior to this episode.  No chest pain or shortness of breath.  No abdominal pain.  No tongue biting or incontinence.  No alcohol or drug use  The history is provided by the patient, a relative and a parent.  Seizures      Home Medications Prior to Admission medications   Medication Sig Start Date End Date Taking? Authorizing Provider  albuterol (PROVENTIL) (2.5 MG/3ML) 0.083% nebulizer solution Take 2.5 mg by nebulization every 6 (six) hours as needed for wheezing or shortness of breath. For shortness of breath/wheezing.    [provider]  budesonide (PULMICORT) 1 MG/2ML nebulizer solution Take 1 mg by nebulization daily.    [provider]  CETIRIZINE HCL CHILDRENS ALRGY 1 MG/ML SOLN Take 10 mLs by mouth daily. 05/15/23   [provider]  clindamycin-benzoyl peroxide (BENZACLIN) gel Apply 1 application topically See admin instructions. Apply to affected areas of the face as directed in the evening/wash face 2 times a day 03/16/18   [provider]  cloBAZam (ONFI) 10 MG tablet 1 and 1/2 in AM and 1 and 1/2 in PM (night) 05/22/23   Keturah Shavers, MD  fluticasone [facility]) 50 MCG/ACT nasal spray Place 2  sprays into both nostrils in the morning.    [provider]  HEALTHYLAX 17 g packet Take 34 g by mouth every 3 (three) days. Patient not taking: Reported on 05/22/2023    [provider]  hydrOXYzine (ATARAX) 10 MG/5ML syrup Take 30 mg by mouth at bedtime. 03/16/18   [provider]  metFORMIN (GLUCOPHAGE) 500 MG tablet Take 500 mg by mouth 2 (two) times daily with a meal. 05/01/23   [provider]  montelukast (SINGULAIR) 5 MG chewable tablet Chew 10 mg by mouth in the morning.    [provider]  naproxen (NAPROSYN) 375 MG tablet Take 375 mg by mouth 2 (two) times daily as needed (for pain). 07/23/21   [provider]  NAYZILAM 5 MG/0.1ML SOLN Apply 5 mg nasally for seizures lasting longer than 5 minutes 11/01/21   Keturah Shavers, MD  ondansetron (ZOFRAN ODT) 4 MG disintegrating tablet Take 1 tablet (4 mg total) by mouth every 8 (eight) hours as needed for nausea or vomiting. 06/23/18   Juliette Alcide, MD  PROAIR HFA 108 [phone] Base) MCG/ACT inhaler Inhale 2 puffs into the lungs every 4 (four) hours as needed for wheezing or shortness of breath.    [provider]  SYMBICORT 160-4.5 MCG/ACT inhaler Inhale 2 puffs into the lungs in the morning. 03/16/18   [provider]  topiramate (TOPAMAX) 200 MG tablet Take 1.5 tablets or 300 mg twice daily 05/22/23   Keturah Shavers, MD  VITAMIN D3 SUPER STRENGTH 50  MCG (2000 UT) CAPS Take 2,000 Units by mouth daily.    [provider]      Allergies    American cockroach, Other, and Timothy grass pollen allergen    Review of Systems   Review of Systems  Unable to perform ROS: Mental status change  Neurological:  Positive for seizures.    Physical Exam Updated Vital Signs BP (!) 140/83 (BP Location: Right Arm)   Pulse (!) 122   Temp 98.8 F (37.1 C) (Axillary)   Resp 17   Ht 6' (1.829 m)   Wt 98.4 kg   SpO2 100%   BMI 29.43 kg/m  Physical Exam Vitals and nursing note  reviewed.  Constitutional:      General: He is not in acute distress.    Appearance: He is well-developed.  HENT:     Head: Normocephalic and atraumatic.     Mouth/Throat:     Pharynx: No oropharyngeal exudate.  Eyes:     Conjunctiva/sclera: Conjunctivae normal.     Pupils: Pupils are equal, round, and reactive to light.  Neck:     Comments: No meningismus. Cardiovascular:     Rate and Rhythm: Regular rhythm. Tachycardia present.     Heart sounds: Normal heart sounds. No murmur heard. Pulmonary:     Effort: Pulmonary effort is normal. No respiratory distress.     Breath sounds: Normal breath sounds.  Abdominal:     Palpations: Abdomen is soft.     Tenderness: There is no abdominal tenderness. There is no guarding or rebound.  Musculoskeletal:        General: No tenderness. Normal range of motion.     Cervical back: Normal range of motion and neck supple.  Skin:    General: Skin is warm.  Neurological:     Mental Status: He is alert.     Cranial Nerves: No cranial nerve deficit.     Motor: No abnormal muscle tone.     Coordination: Coordination normal.     Comments:  Oriented to person and place.  No tongue biting.  No incontinence.  5/5 strength throughout.  Cranial nerves II to XII intact.  Tongue is midline.   Psychiatric:        Behavior: Behavior normal.     ED Results / Procedures / Treatments   Labs (all labs ordered are listed, but only abnormal results are displayed) Labs Reviewed  COMPREHENSIVE METABOLIC PANEL - Abnormal; Notable for the following components:      Result Value   CO2 13 (*)    Glucose, Bld 105 (*)    Total Bilirubin <0.1 (*)    Anion gap 17 (*)    All other components within normal limits  CBC WITH DIFFERENTIAL/PLATELET - Abnormal; Notable for the following components:   WBC 12.1 (*)    Lymphs Abs 6.0 (*)    Monocytes Absolute 1.1 (*)    Abs Immature Granulocytes 0.09 (*)    All other components within normal limits  BASIC METABOLIC  PANEL - Abnormal; Notable for the following components:   Potassium 3.2 (*)    CO2 20 (*)    Glucose, Bld 102 (*)    Calcium 8.7 (*)    All other components within normal limits  ETHANOL  RAPID URINE DRUG SCREEN, HOSP PERFORMED  URINALYSIS, ROUTINE W REFLEX MICROSCOPIC  CBG MONITORING, ED    EKG None  Radiology CT Head Wo Contrast  Result Date: 07/11/2023 CLINICAL DATA:  Seizure disorder EXAM: CT HEAD  WITHOUT CONTRAST TECHNIQUE: Contiguous axial images were obtained from the base of the skull through the vertex without intravenous contrast. RADIATION DOSE REDUCTION: This exam was performed according to the departmental dose-optimization program which includes automated exposure control, adjustment of the mA and/or kV according to patient size and/or use of iterative reconstruction technique. COMPARISON:  08/03/2022 FINDINGS: Brain: There is no mass, hemorrhage or extra-axial collection. The size and configuration of the ventricles and extra-axial CSF spaces are normal. The brain parenchyma is normal, without acute or chronic infarction. Vascular: No abnormal hyperdensity of the major intracranial arteries or dural venous sinuses. No intracranial atherosclerosis. Skull: The visualized skull base, calvarium and extracranial soft tissues are normal. Sinuses/Orbits: No fluid levels or advanced mucosal thickening of the visualized paranasal sinuses. No mastoid or middle ear effusion. The orbits are normal. IMPRESSION: Normal head CT. Electronically Signed   By: Deatra Robinson M.D.   On: 07/11/2023 03:20    Procedures Procedures    Medications Ordered in ED Medications  lactated ringers bolus 1,000 mL (1,000 mLs Intravenous New Bag/Given 07/11/23 0211)    ED Course/ Medical Decision Making/ A&P                                 Medical Decision Making Amount and/or Complexity of Data Reviewed Independent Historian: guardian Labs: ordered. Decision-making details documented in ED  Course. Radiology: ordered and independent interpretation performed. Decision-making details documented in ED Course. ECG/medicine tests: ordered and independent interpretation performed. Decision-making details documented in ED Course.  Risk Prescription drug management.   Seizure with known seizure history.  Compliant with medications.  Patient with vomiting now which is not atypical for him after a seizure.  Vitals are stable.  Somewhat tachycardic.  No fever.  No recent infectious symptoms.  Patient given IV fluids.  Labs obtained.  Mild acidosis as expected after seizure  CT head shows no acute findings.  Patient does not have a VP shunt.  Acidosis has improved.  Father confirms patient is back to baseline  D/w Dr. Sheppard Penton of pediatric neurology covering for Dr. Merri Brunette.  She recommends increasing Onfi to 15 mg in the morning and 20 mg at night.  Continue 300 mg of Topamax twice daily  Follow-up with his neurologist for recheck this week. Return to the ED with new or worsening symptoms.       Final Clinical Impression(s) / ED Diagnoses Final diagnoses:  Seizures [facility])    Rx / DC Orders ED Discharge Orders     None         Kazuma Elena, Jeannett Senior, MD 07/11/23 [phone]

## 2023-07-11 NOTE — Discharge Instructions (Signed)
Increase your clobazam to 15 mg in the morning and 20 mg at night. Do not drive or operate heavy machinery. Continue your Topamax as prescribed.  Call your neurologist for an appointment this week.  Return to the ED with new or worsening symptoms.

## 2023-07-11 NOTE — ED Triage Notes (Signed)
Patient BIB [facility] c/o seizure that last approx 2 mins PTA.  Patient has history of same, last seizure a year ago.  Patient compliant with all meds.  Patient at baseline currently.  20 G L AC 105/60 105 HR 98% RA CBG 99

## 2023-10-16 ENCOUNTER — Telehealth (INDEPENDENT_AMBULATORY_CARE_PROVIDER_SITE_OTHER): Payer: Self-pay | Admitting: Neurology

## 2023-10-16 DIAGNOSIS — G40909 Epilepsy, unspecified, not intractable, without status epilepticus: Secondary | ICD-10-CM

## 2023-10-16 MED ORDER — TOPIRAMATE 200 MG PO TABS: ORAL_TABLET | ORAL | 7 refills | Status: DC

## 2023-10-16 MED ORDER — CLOBAZAM 10 MG PO TABS
ORAL_TABLET | ORAL | 5 refills | Status: DC
Start: 2023-10-16 — End: 2023-12-22

## 2023-10-16 NOTE — Telephone Encounter (Signed)
Stephen Shaw, MD23 minutes ago (4:08 PM)   Please call father and tell him to continue Topamax at the same dose of 300 mg twice daily or 1.5 tablet twice daily And continue Onfi at 20 mg twice daily which would be 2 tablets twice daily until the next appointment at the end of January. I sent in a prescription for both of these medications.    Call back to dad and advised. He may have enough med to last until next refill by decreasing it by 1 tab a day if not let us know

## 2023-10-16 NOTE — Telephone Encounter (Signed)
Call to pharm. They are dispensing 90 tabs of the 200 mg Topamax. Pharm questions if the tablet is crumbling and are using more.  Call to dad- He reports when he went to the hospital with seizures they told him to give 400 mg in AM and 400 mg in the PM. That is why he is running short. RN will route message to provider to re-order medication. RN sees the Onfi dose was changed but does not see the Topamax change in the notes. D/w Dr. Sheppard Penton of pediatric neurology covering for Dr. Merri Brunette.  She recommends increasing Onfi to 15 mg in the morning and 20 mg at night.  Continue 300 mg of Topamax twice daily   Call back to dad to determine how much Onfi he is giving. He reports 2 in AM and 2 in PM but is not running short on it. RN read note above to him and advised will ask Dr. Devonne Doughty to determine the dose he wants him to take and to send in new rx's for them. Dad agrees with plan.

## 2023-10-16 NOTE — Telephone Encounter (Signed)
Please call father and tell him to continue Topamax at the same dose of 300 mg twice daily or 1.5 tablet twice daily And continue Onfi at 20 mg twice daily which would be 2 tablets twice daily until the next appointment at the end of January. I sent in a prescription for both of these medications.

## 2023-10-16 NOTE — Telephone Encounter (Signed)
  Name of who is calling: Barbaraann Cao   Caller's Relationship to Patient: dad  Best contact number: [phone]  Provider they see: Nab   Reason for call: dad called regarding refill on seizure medication, he says that he seems to always run short a week before he is supposed to get his next refill (next Friday). He says he only has about 4 days worth left and he would like to know if that can be refilled in advance.      PRESCRIPTION REFILL ONLY  Name of prescription: Topamax   Pharmacy: [facility] [address] Ginette Otto [place]

## 2023-12-22 ENCOUNTER — Ambulatory Visit (INDEPENDENT_AMBULATORY_CARE_PROVIDER_SITE_OTHER): Payer: MEDICAID | Admitting: Neurology

## 2023-12-22 ENCOUNTER — Encounter (INDEPENDENT_AMBULATORY_CARE_PROVIDER_SITE_OTHER): Payer: Self-pay | Admitting: Neurology

## 2023-12-22 VITALS — BP 126/70 | HR 62 | Ht 72.09 in | Wt 213.2 lb

## 2023-12-22 DIAGNOSIS — G40909 Epilepsy, unspecified, not intractable, without status epilepticus: Secondary | ICD-10-CM | POA: Diagnosis not present

## 2023-12-22 DIAGNOSIS — Q753 Macrocephaly: Secondary | ICD-10-CM

## 2023-12-22 DIAGNOSIS — F840 Autistic disorder: Secondary | ICD-10-CM

## 2023-12-22 MED ORDER — TOPIRAMATE 200 MG PO TABS: ORAL_TABLET | ORAL | 9 refills | Status: DC

## 2023-12-22 MED ORDER — CLOBAZAM 10 MG PO TABS
ORAL_TABLET | ORAL | 5 refills | Status: DC
Start: 2023-12-22 — End: 2024-06-21

## 2023-12-22 NOTE — Patient Instructions (Signed)
Continue Topamax at the same dose of 1.5 tablet 2 times a day or 1 tablet in the morning and 2 tablet at night Continue Onfi with 2 tablets twice daily Continue with adequate sleep and limited screen time Have nasal spray in case of prolonged seizure activity Return in 9 months for follow-up visit

## 2023-12-22 NOTE — Progress Notes (Signed)
Patient: Stephen Shaw MRN: [id number] Sex: male DOB: February 08, 2001  Provider: Keturah Shavers, MD Location of Care: [facility]  Note type: Routine return visit  Referral Source: Christel Mormon, MD History from: patient, [facility] chart, and Dad Chief Complaint: Seizures   History of Present Illness: Stephen Shaw is a 23 y.o. male is here for follow-up management of seizure disorder with 1 breakthrough seizure a few months ago. He has a diagnosis of autism spectrum disorder, microcephaly, developmental delay/intellectual disability and clinical seizure disorder but with normal EEGs, currently on 2 AEDs including Topamax and Onfi with fairly good seizure control and no side effects although he had 1 episode of clinical seizure activity after his last visit in June which happened on 07/11/2023 for which he was seen in the emergency room and the dose of medications increased. On his last visit, he was recommended to continue with Topamax 300 mg twice daily and Onfi 15 mg twice daily and then the dose of Onfi increased to 20 mg twice daily and recommended to follow-up in a few months. In August when he had an episode of clinical seizure activity, the dose of both medications increased with Topamax at 400 mg twice daily and Onfi at 20 mg twice daily but after a couple of months further decrease the dose of medication back to Topamax 300 mg twice daily and Onfi 15 mg twice daily which is the current dose that he is taking. Over the past 5 months he has not had any clinical seizure activity and he has been taking the medication regularly without any missing doses.  He usually sleeps well without any difficulty.  He has not had any change in behavior and father does not have any other complaints or concerns at this time. He does have nasal spray as a rescue medication in case of prolonged seizure activity.  He has no other symptoms with no evidence of increased ICP such as  headache, vomiting or abnormal eye movements.  Review of Systems: Review of system as per HPI, otherwise negative.  Past Medical History:  Diagnosis Date   Asthma    Autism    Back pain    Down syndrome    Memory loss    Scoliosis    Seizures (HCC)    Type 2 diabetes mellitus (HCC)    Vision abnormalities    Hospitalizations: No., Head Injury: No., Nervous System Infections: No., Immunizations up to date: Yes.     Surgical History History reviewed. No pertinent surgical history.  Family History family history includes Heart Problems in an other family member; Seizures in an other family member.   Social History  Social History Narrative   Lives with dad at home. Not in school. Dad stated he graduated.   Dad needs a note for land lord to change to a 1 st floor apt due to patients spine issues and seizures.   Social Drivers of Corporate investment banker Strain: Not at Risk (01/06/2023)   Received from [place], [place]   [employer]    Financial Resource Strain: 1  Food Insecurity: At Risk (01/06/2023)   [place], [place]   Food Insecurity    Food: 2  Transportation Needs: Not at Risk (01/06/2023)   Received from [place], [employer] Needs    Transportation: 1  Physical Activity: Not on File (03/21/2022)   Received from [place], [place]   Physical Activity    Physical Activity: 0  Stress: Not on File (03/21/2022)   Received from [facility], [place]   Stress    Stress: 0  Social Connections: Not on File (08/07/2023)   Received from [facility]   Social Connections    Connectedness: 0     Allergies  Allergen Reactions   American Cockroach    Other Itching and Other (See Comments)    Tree's Box Elder   Timothy Grass Pollen Allergen Itching    Physical Exam BP 126/70   Pulse 62   Ht 6' 0.09" (1.831 m)   Wt 213 lb 3 oz (96.7 kg)   BMI 28.84 kg/m  Gen: Awake, alert, not in distress, Non-toxic appearance. Skin: No neurocutaneous stigmata,  no rash HEENT: Macrocephalic with some frontal bossing, no conjunctival injection, nares patent, mucous membranes moist, oropharynx clear. Neck: Supple, no meningismus, no lymphadenopathy,  Resp: Clear to auscultation bilaterally CV: Regular rate, normal S1/S2, no murmurs, no rubs Abd: Bowel sounds present, abdomen soft, non-tender, non-distended.  No hepatosplenomegaly or mass. Ext: Warm and well-perfused. No deformity, no muscle wasting, ROM full.  Neurological Examination: MS- Awake, alert, interactive Cranial Nerves- Pupils equal, round and reactive to light (5 to 3mm); fix and follows with full and smooth EOM; no nystagmus; no ptosis, funduscopy with normal sharp discs, visual field full by looking at the toys on the side, face symmetric with smile.  Hearing intact to bell bilaterally, palate elevation is symmetric,  Tone- Normal Strength-Seems to have good strength, symmetrically by observation and passive movement. Reflexes-    Biceps Triceps Brachioradialis Patellar Ankle  R 2+ 2+ 2+ 2+ 2+  L 2+ 2+ 2+ 2+ 2+   Plantar responses flexor bilaterally, no clonus noted Sensation- Withdraw at four limbs to stimuli. Coordination- Reached to the object with no dysmetria Gait: Normal walk without any coordination or balance issues.   Assessment and Plan 1. Seizure disorder (HCC)   2. Macrocephaly   3. Autism spectrum disorder     This is a 23 year old male with diagnosis of autism, macrocephaly, developmental delay and seizure disorder, currently on 2 AEDs with fairly good seizure control with just 1 breakthrough seizure in August.  He has no new findings on his neurological exam. Recommend to continue Topamax at the same dose of 300 mg twice daily or he may take 200 mg in a.m. and 400 mg in p.m. if it is hard to cut the tablets in half. For Onfi I think it would be better to continue with slightly higher dose of medication as he was on in the past which would be 20 mg twice daily which  is still moderate dose of medication based on his weight He does have nasal spray as a rescue medication in case of prolonged seizure activity He will treat with adequate sleep and limited screen time If he follows any seizure activity, father will call my office and let me know Otherwise I would like to see him in 9 months for follow-up visit to adjust the dose of medication if needed.  Father understood and agreed with the plan.    Meds ordered this encounter  Medications   topiramate (TOPAMAX) 200 MG tablet    Sig: Take 1.5 tablets or 300 mg twice daily    Dispense:  90 tablet    Refill:  9   cloBAZam (ONFI) 10 MG tablet    Sig: Take 2 tabs or 20 mg bid    Dispense:  120 tablet    Refill:  5   No  orders of the defined types were placed in this encounter.

## 2024-02-12 ENCOUNTER — Other Ambulatory Visit (INDEPENDENT_AMBULATORY_CARE_PROVIDER_SITE_OTHER): Payer: Self-pay | Admitting: Neurology

## 2024-02-12 DIAGNOSIS — G40909 Epilepsy, unspecified, not intractable, without status epilepticus: Secondary | ICD-10-CM

## 2024-05-20 ENCOUNTER — Emergency Department (HOSPITAL_COMMUNITY)
Admission: EM | Admit: 2024-05-20 | Discharge: 2024-05-21 | Disposition: A | Discharge status: Home / Self Care | Payer: MEDICAID | Attending: Emergency Medicine | Admitting: Emergency Medicine

## 2024-05-20 ENCOUNTER — Emergency Department (HOSPITAL_COMMUNITY): Payer: MEDICAID

## 2024-05-20 ENCOUNTER — Other Ambulatory Visit: Payer: Self-pay

## 2024-05-20 DIAGNOSIS — R569 Unspecified convulsions: Secondary | ICD-10-CM

## 2024-05-20 DIAGNOSIS — Z7984 Long term (current) use of oral hypoglycemic drugs: Secondary | ICD-10-CM | POA: Diagnosis not present

## 2024-05-20 DIAGNOSIS — E119 Type 2 diabetes mellitus without complications: Secondary | ICD-10-CM | POA: Diagnosis not present

## 2024-05-20 DIAGNOSIS — J45909 Unspecified asthma, uncomplicated: Secondary | ICD-10-CM | POA: Insufficient documentation

## 2024-05-20 LAB — CBC WITH DIFFERENTIAL/PLATELET
Abs Immature Granulocytes: 0.08 10*3/uL — ABNORMAL HIGH (ref 0.00–0.07)
Basophils Absolute: 0 10*3/uL (ref 0.0–0.1)
Basophils Relative: 1 %
Eosinophils Absolute: 0.1 10*3/uL (ref 0.0–0.5)
Eosinophils Relative: 2 %
HCT: 42.9 % (ref 39.0–52.0)
Hemoglobin: 14.3 g/dL (ref 13.0–17.0)
Immature Granulocytes: 1 %
Lymphocytes Relative: 45 %
Lymphs Abs: 2.8 10*3/uL (ref 0.7–4.0)
MCH: 30.6 pg (ref 26.0–34.0)
MCHC: 33.3 g/dL (ref 30.0–36.0)
MCV: 91.9 fL (ref 80.0–100.0)
Monocytes Absolute: 0.6 10*3/uL (ref 0.1–1.0)
Monocytes Relative: 10 %
Neutro Abs: 2.6 10*3/uL (ref 1.7–7.7)
Neutrophils Relative %: 41 %
Platelets: 285 10*3/uL (ref 150–400)
RBC: 4.67 MIL/uL (ref 4.22–5.81)
RDW: 13 % (ref 11.5–15.5)
WBC: 6.3 10*3/uL (ref 4.0–10.5)
nRBC: 0 % (ref 0.0–0.2)

## 2024-05-20 LAB — MAGNESIUM: Magnesium: 2 mg/dL (ref 1.7–2.4)

## 2024-05-20 LAB — BASIC METABOLIC PANEL WITH GFR
Anion gap: 12 (ref 5–15)
BUN: 12 mg/dL (ref 6–20)
CO2: 16 mmol/L — ABNORMAL LOW (ref 22–32)
Calcium: 9.2 mg/dL (ref 8.9–10.3)
Chloride: 113 mmol/L — ABNORMAL HIGH (ref 98–111)
Creatinine, Ser: 1.13 mg/dL (ref 0.61–1.24)
GFR, Estimated: >60 mL/min (ref >60)
Glucose, Bld: 85 mg/dL (ref 70–99)
Potassium: 4.1 mmol/L (ref 3.5–5.1)
Sodium: 141 mmol/L (ref 135–145)

## 2024-05-20 MED ORDER — LEVETIRACETAM (KEPPRA) 500 MG/5 ML ADULT IV PUSH
4500.0000 mg | Freq: Once | INTRAVENOUS | Status: AC
  Administered 2024-05-20: 4500 mg via INTRAVENOUS
  Filled 2024-05-20: qty 45

## 2024-05-20 NOTE — ED Notes (Signed)
 Confirmed with pharmacy regarding 4500mg  of keppra.

## 2024-05-20 NOTE — ED Triage Notes (Signed)
 Witnessed seizure tonight by dad. Arrives back to baseline. Hx of autism.  Lasted about 2 minutes.

## 2024-05-20 NOTE — ED Notes (Signed)
 Pt transported to CT ?

## 2024-05-21 ENCOUNTER — Telehealth (INDEPENDENT_AMBULATORY_CARE_PROVIDER_SITE_OTHER): Payer: Self-pay

## 2024-05-21 NOTE — ED Notes (Signed)
 Pt resting, eyes closed. No distress noted. Family at bedside.

## 2024-05-21 NOTE — Telephone Encounter (Signed)
 General Seizure Questions  Ask frequency of seizures - first seizure within a year  Ask when last seizure occurred. 1 seizure yesterday  Ask to describe seizures - Normal seizure as dad states  Ask about seizure medications -  patient is taking Topamax  1.5 tablets twice daily   Ask about side effects. Dad states he bit his tongue and threw up   Ask if the patient has been sick, under undue stress, has missed sleep. Dad states he hasn't missed sleep been stressed or been sick   If the caller reports a rash, ask when the med was started, if any other meds were given at the same time, any different foods, detergents, lotions, etc. ...............Aaron Aas Dad states he's been doing everything like normal   Get description of rash, along with any other symptoms with the rash (nausea, vomiting, diarrhea, etc). Sometimes best to have patient stop by to look at rash if parents have difficulty describing or are unreliable in description.............. He stated he vomited, dad stated he did spray disinfected spray a little before he seized but he is not sure if that is the cause  Dad did state he didn't know if he needed a medication change or what he wanted to do next

## 2024-05-21 NOTE — ED Provider Notes (Signed)
 [facility] AT [facility] Provider Note   CSN: [id number] Arrival date & time: 05/20/24  2218     Patient presents with: Seizures   Stephen Shaw is a 23 y.o. male.   The history is provided by a parent. The history is limited by the condition of the patient.  Seizures Seizure activity on arrival: no   Preceding symptoms: no sensation of an aura present   Initial focality:  None Episode characteristics: generalized shaking   Return to baseline: yes   Severity:  Moderate Duration:  2 minutes Timing:  Once Progression:  Resolved Context: not fever and not previous head injury   Patient with seizures presents with 1 witnessed seizure tonight.  Lasted 2 minutes.      Past Medical History:  Diagnosis Date   Asthma    Autism    Back pain    Down syndrome    Memory loss    Scoliosis    Seizures (HCC)    Type 2 diabetes mellitus (HCC)    Vision abnormalities      Prior to Admission medications   Medication Sig Start Date End Date Taking? Authorizing Provider  albuterol  (PROVENTIL ) (2.5 MG/3ML) 0.083% nebulizer solution Take 2.5 mg by nebulization every 6 (six) hours as needed for wheezing or shortness of breath. For shortness of breath/wheezing.    [provider]  budesonide (PULMICORT) 1 MG/2ML nebulizer solution Take 1 mg by nebulization daily.    [provider]  CETIRIZINE HCL CHILDRENS ALRGY 1 MG/ML SOLN Take 10 mLs by mouth daily. 05/15/23   [provider]  clindamycin-benzoyl peroxide (BENZACLIN) gel Apply 1 application topically See admin instructions. Apply to affected areas of the face as directed in the evening/wash face 2 times a day Patient not taking: Reported on 12/22/2023 03/16/18   [provider]  cloBAZam  (ONFI ) 10 MG tablet Take 2 tabs or 20 mg bid 12/22/23   Ventura Gins, MD  fluticasone [facility]) 50 MCG/ACT nasal spray Place 2 sprays into both nostrils in the morning.    [provider]  HEALTHYLAX 17 g packet Take 34 g by mouth every 3 (three) days. Patient not taking: Reported on 05/22/2023    [provider]  hydrOXYzine (ATARAX) 10 MG/5ML syrup Take 30 mg by mouth at bedtime. 03/16/18   [provider]  metFORMIN (GLUCOPHAGE) 500 MG tablet Take 500 mg by mouth 2 (two) times daily with a meal. 05/01/23   [provider]  montelukast (SINGULAIR) 5 MG chewable tablet Chew 10 mg by mouth in the morning.    [provider]  naproxen (NAPROSYN) 375 MG tablet Take 375 mg by mouth 2 (two) times daily as needed (for pain). 07/23/21   [provider]  NAYZILAM  5 MG/0.1ML SOLN Apply 5 mg nasally for seizures lasting longer than 5 minutes 11/01/21   Ventura Gins, MD  ondansetron  (ZOFRAN  ODT) 4 MG disintegrating tablet Take 1 tablet (4 mg total) by mouth every 8 (eight) hours as needed for nausea or vomiting. Patient not taking: Reported on 12/22/2023 06/23/18   Sharen Daubs, MD  PROAIR  HFA 108 [phone] Base) MCG/ACT inhaler Inhale 2 puffs into the lungs every 4 (four) hours as needed for wheezing or shortness of breath.    [provider]  SYMBICORT 160-4.5 MCG/ACT inhaler Inhale 2 puffs into the lungs in the morning. 03/16/18   [provider]  topiramate  (TOPAMAX ) 200 MG tablet Take 1.5 tablets or 300 mg  twice daily 12/22/23   Ventura Gins, MD  VITAMIN D3 SUPER STRENGTH 50 MCG (2000 UT) CAPS Take 2,000 Units by mouth daily.    [provider]    Allergies: American cockroach, Other, and Timothy grass pollen allergen    Review of Systems  Unable to perform ROS: Other  Constitutional:  Negative for fever.  Respiratory:  Negative for wheezing and stridor.   Neurological:  Positive for seizures.    Updated Vital Signs BP 101/61   Pulse 66   Temp 98.1 F (36.7 C)   Resp 13   Ht 6' (1.829 m)   Wt 99.8 kg   SpO2 100%   BMI 29.84 kg/m   Physical Exam Vitals and nursing note reviewed.   Constitutional:      General: He is not in acute distress.    Appearance: Normal appearance. He is well-developed. He is not diaphoretic.  HENT:     Head: Atraumatic.     Nose: Nose normal.   Eyes:     Conjunctiva/sclera: Conjunctivae normal.     Pupils: Pupils are equal, round, and reactive to light.    Cardiovascular:     Rate and Rhythm: Normal rate and regular rhythm.     Pulses: Normal pulses.     Heart sounds: Normal heart sounds.  Pulmonary:     Effort: Pulmonary effort is normal.     Breath sounds: Normal breath sounds. No wheezing or rales.  Abdominal:     General: Bowel sounds are normal.     Palpations: Abdomen is soft.     Tenderness: There is no abdominal tenderness. There is no guarding or rebound.   Musculoskeletal:        General: Normal range of motion.     Cervical back: Normal range of motion and neck supple.   Skin:    General: Skin is warm and dry.     Capillary Refill: Capillary refill takes less than 2 seconds.   Neurological:     Mental Status: He is alert.     Deep Tendon Reflexes: Reflexes normal.     (all labs ordered are listed, but only abnormal results are displayed) Results for orders placed or performed during the hospital encounter of 05/20/24  CBC with Differential   Collection Time: 05/20/24 11:01 PM  Result Value Ref Range   WBC 6.3 4.0 - 10.5 K/uL   RBC 4.67 4.22 - 5.81 MIL/uL   Hemoglobin 14.3 13.0 - 17.0 g/dL   HCT 53.6 64.4 - 03.4 %   MCV 91.9 80.0 - 100.0 fL   MCH 30.6 26.0 - 34.0 pg   MCHC 33.3 30.0 - 36.0 g/dL   RDW 74.2 59.5 - 63.8 %   Platelets 285 150 - 400 K/uL   nRBC 0.0 0.0 - 0.2 %   Neutrophils Relative % 41 %   Neutro Abs 2.6 1.7 - 7.7 K/uL   Lymphocytes Relative 45 %   Lymphs Abs 2.8 0.7 - 4.0 K/uL   Monocytes Relative 10 %   Monocytes Absolute 0.6 0.1 - 1.0 K/uL   Eosinophils Relative 2 %   Eosinophils Absolute 0.1 0.0 - 0.5 K/uL   Basophils Relative 1 %   Basophils Absolute 0.0 0.0 - 0.1 K/uL    Immature Granulocytes 1 %   Abs Immature Granulocytes 0.08 (H) 0.00 - 0.07 K/uL  Basic metabolic panel   Collection Time: 05/20/24 11:01 PM  Result Value Ref Range   Sodium 141 135 - 145 mmol/L  Potassium 4.1 3.5 - 5.1 mmol/L   Chloride 113 (H) 98 - 111 mmol/L   CO2 16 (L) 22 - 32 mmol/L   Glucose, Bld 85 70 - 99 mg/dL   BUN 12 6 - 20 mg/dL   Creatinine, Ser 1.61 0.61 - 1.24 mg/dL   Calcium  9.2 8.9 - 10.3 mg/dL   GFR, Estimated >09 >60 mL/min   Anion gap 12 5 - 15  Magnesium   Collection Time: 05/20/24 11:01 PM  Result Value Ref Range   Magnesium 2.0 1.7 - 2.4 mg/dL   CT Head Wo Contrast Result Date: 05/20/2024 EXAM: CT HEAD WITHOUT CONTRAST 05/20/2024 11:37:10 PM TECHNIQUE: CT of the head was performed without the administration of intravenous contrast. Automated exposure control, iterative reconstruction, and/or weight based adjustment of the mA/kV was utilized to reduce the radiation dose to as low as reasonably achievable. COMPARISON: 07/11/2023 CLINICAL HISTORY: Polytrauma, blunt. Witnessed seizure tonight by dad. Arrives back to baseline. Hx of autism. Lasted about 2 minutes. FINDINGS: BRAIN AND VENTRICLES: There is no acute intracranial hemorrhage, mass effect or midline shift. No abnormal extra-axial fluid collection. The gray-white differentiation is maintained without an acute infarct. There is no hydrocephalus. ORBITS: The visualized portion of the orbits demonstrate no acute abnormality. SINUSES: The visualized paranasal sinuses and mastoid air cells demonstrate no acute abnormality. SOFT TISSUES AND SKULL: No acute abnormality of the visualized skull or soft tissues. IMPRESSION: 1. No acute intracranial abnormality. Electronically signed by: Zadie Herter MD 05/20/2024 11:42 PM EDT RP Workstation: [id number]    EKG: EKG Interpretation Date/Time:  Thursday May 20 2024 22:23:02 EDT Ventricular Rate:  98 PR Interval:  147 QRS Duration:  100 QT Interval:  347 QTC  Calculation: 443 R Axis:   61  Text Interpretation: Sinus rhythm Consider right ventricular hypertrophy Confirmed by Katisha Shimizu ([id number]) on 05/20/2024 11:00:11 PM  Radiology: CT Head Wo Contrast Result Date: 05/20/2024 EXAM: CT HEAD WITHOUT CONTRAST 05/20/2024 11:37:10 PM TECHNIQUE: CT of the head was performed without the administration of intravenous contrast. Automated exposure control, iterative reconstruction, and/or weight based adjustment of the mA/kV was utilized to reduce the radiation dose to as low as reasonably achievable. COMPARISON: 07/11/2023 CLINICAL HISTORY: Polytrauma, blunt. Witnessed seizure tonight by dad. Arrives back to baseline. Hx of autism. Lasted about 2 minutes. FINDINGS: BRAIN AND VENTRICLES: There is no acute intracranial hemorrhage, mass effect or midline shift. No abnormal extra-axial fluid collection. The gray-white differentiation is maintained without an acute infarct. There is no hydrocephalus. ORBITS: The visualized portion of the orbits demonstrate no acute abnormality. SINUSES: The visualized paranasal sinuses and mastoid air cells demonstrate no acute abnormality. SOFT TISSUES AND SKULL: No acute abnormality of the visualized skull or soft tissues. IMPRESSION: 1. No acute intracranial abnormality. Electronically signed by: Zadie Herter MD 05/20/2024 11:42 PM EDT RP Workstation: [id number]     Procedures   Medications Ordered in the ED  levETIRAcetam (KEPPRA) undiluted injection 4,500 mg (4,500 mg Intravenous Given 05/20/24 2321)                                    Medical Decision Making Amount and/or Complexity of Data Reviewed Labs: ordered.    Details: Normal white count 6.3, normal hemoglobin 14.3, normal platelets.  Normal sodium 141, normal potassium 4.1, normal creatinine 1.13 Radiology: ordered and independent interpretation performed.    Details: Negative head CT by me  Discussion of management or  test interpretation with external  provider(s): Case d/w Dr. Colvin Dec of peds neuro.  Have family call in am to discuss potential medication changes.    Risk Risk Details: No further seizures in the ED, observed.  Discussed close follow up and calling neuro in Am with dad.  Stable for discharge.        Final diagnoses:  Seizure (HCC)   No signs of systemic illness or infection. The patient is nontoxic-appearing on exam and vital signs are within normal limits.  I have reviewed the triage vital signs and the nursing notes. Pertinent labs & imaging results that were available during my care of the patient were reviewed by me and considered in my medical decision making (see chart for details). After history, exam, and medical workup I feel the patient has been appropriately medically screened and is safe for discharge home. Pertinent diagnoses were discussed with the patient. Patient was given return precautions.  ED Discharge Orders     None          Terrell Ostrand, MD 05/21/24 [phone]

## 2024-05-22 LAB — TOPIRAMATE LEVEL: Topiramate Lvl: 11.6 ug/mL (ref 2.0–25.0)

## 2024-05-26 NOTE — Progress Notes (Signed)
 [facility]  [facility]  Follow-up Note  Referring Doctor: No ref. provider found Primary Doctor: Stephen Rupert Rigg, NP  Stephen Shaw is a 23 y.o. old male being seen today by Shelly Charity MD  for follow-up.   Medical records reviewed today for this visit: CareEverywhere notes, Family Medicine, Pediatrics  Chief Complaint:  Chief Complaint  Patient presents with  . Back Pain   History of Present Illness:  Patient is here today for follow-up of his chronic pain involving his back.  The patient is accompanied by his father who also provides additional history during today's visit.  Patient denies fevers, chills, ataxia, falls, progressive weakness, saddle anesthesia, and bladder/bowel changes. Additional pain history/assessment/scores noted below:  Per review of intake: Pain Description: Since the last visit the pain is improved. The most significant location of pain is the lower, middle, and upper back. The 1-2 words that best describe the pain: throbbing and aching The pain improves with prescription pain medications. The pain is made worse with walking, standing, sitting, and most movements and general daily activities. The average daily pain score is 3/10. The pain can be as high as 6/10 at its worst. The pain interferes with: All activities.   Therapies: Did the patient have an injection/procedure since the last visit? No              Has the patient had any new imaging (X-ray, MRI, CT) for pain concerns since the last visit? No   Has physical therapy been initiated or completed for this pain concern? Yes - Where was physical therapy completed? At an outside clinic (list clinic name and/or location): [place] Has a home exercise program (directed by a physical therapist or medical provider) been completed for this pain concern? Yes   Medications: Was a new medication for pain started by our clinic at the last visit?  Yes             - Is the new medication useful for pain? No             - Has the new medication caused any significant side effects? Yes: night sweats    Is the patient on a blood thinner (including aspirin, Goody/BC/Bayer powder)? No  A validated pain assessment tool was completed today: What number best describes your pain on average in the past week?: 4 What number best describes how, during the past week, pain has interfered with your enjoyment of life?: 6 What number best describes how, during the past week, pain has interfered with your general activity?: 6 PEG Pain Total Score: 5.33   Review of Systems: A complete ROS was performed with positive/negative pertinent findings listed in HPI.  ____________________________________________________________________ Past Medical History: Past Medical History:  Diagnosis Date  . Allergy   . Asthma (CMD)   . Autism (CMD)      Past Surgical History: No past surgical history on file.  Family History: Family History  Problem Relation Name Age of Onset  . Hypertension Father    . Hypertension Paternal Grandmother      Social History: Social History   Tobacco Use  . Smoking status: Never  . Smokeless tobacco: Never  Substance Use Topics  . Alcohol use: Never    Allergies: American cockroach allergenic extract; Grass pollen-timothy, standard; Cockroach; Diclofenac; and Grass pollen-meadow fescue, standard  Current Medications: Current Outpatient Medications  Medication Sig Dispense Refill  . albuterol  2.5 mg /3 mL (0.083 %)  nebulizer solution 2.5 mg as needed.    . budesonide (PULMICORT) 1 mg/2 mL Take 1 mg by nebulization in the morning and 1 mg before bedtime.    . budesonide-formoteroL (SYMBICORT) 160-4.5 mcg/actuation inhaler Inhale 2 puffs 2 (two) times a day.    . cetirizine (ZyrTEC) 1 mg/mL syrup Take by mouth 2 (two) times a day.    . cholecalciferol (VITAMIN D3) 2,000 unit cap capsule Take 2,000 Units by mouth  daily.    . clindamycin-benzoyl peroxide 1-5 % glwp     . cloBAZam  (ONFI ) 10 mg tab tablet Take 10 mg by mouth 2 (two) times a day.    . fluticasone propionate (FLONASE) 50 mcg/spray nasal spray 2 sprays.    . hydrOXYzine (ATARAX) 10 mg/5 mL solution Take 10 mg by mouth nightly.    SABRA ketoconazole (NIZORAL) 2 % cream APPLY TO AFFECTED AREA topically EVERY DAY FOR 2 WEEKS.    . ketoconazole (NIZORAL) 2 % shampoo Apply to stomach and back 2 to 3 times weekly.    . metFORMIN (GLUCOPHAGE) 500 mg tablet Take 500 mg by mouth in the morning and 500 mg in the evening. Take with meals.    . midazolam  (Nayzilam ) 5 mg/spray (0.1 mL) spry Administer 1 spray into affected nostril(s).    . montelukast (SINGULAIR) 5 mg chewable tablet Chew 5 mg.    . naproxen (NAPROSYN) 375 mg tablet     . polyethylene glycol (MIRALAX) 17 gram powd powder Take 17 g by mouth daily.    . topiramate  (TOPAMAX ) 200 mg tablet Take 200 mg by mouth 2 (two) times a day.    . diclofenac (VOLTAREN) 50 mg EC tablet Take 1 tablet (50 mg total) by mouth 2 (two) times a day as needed (for moderate to severe pain.). 60 tablet 0   No current facility-administered medications for this visit.    Review of Systems:  ROS completed on pain intake sheet reviewed, pertinent positive/negative findings related to this visit are noted in the HPI and Assessment/Plan.  ____________________________________________________________________ Vitals: Vitals:   05/26/24 0852  BP: 131/85  Pulse: 83  SpO2: 100%    Physical Exam: General: no acute distress, non-diaphoretic HENT: Anicteric, no conjunctivitis, moist mucous membranes, hearing intact NECK: no erythema noted at the neck, trachea midline Cardiovascular: adequate capillary refill Respiratory: normal effort, no tactile fremitus Musculoskeletal exam: Spine exam:   Range of motion limited in all planes.  Provocative maneuvers deferred given current pain levels. Neurologic: Slow gait Motor:  Moving all extremities against gravity Patient transfers independently from seated to standing, and standing to seated.  Psychiatric: Cooperative; awake, alert, and oriented to person, place and time. Appropriate mood and affect. Skin: warm, dry  ____________________________________________________________________ Controlled Substance Monitoring: NCPDMP: Reviewed Yes  UDS (Urine Drug Screen) & Results: Pain Management Panel       * No data to display          ____________________________________________________________________ Imaging and Diagnostic Studies:  All applicable diagnostic studies related to this consultation have been reviewed. 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   TECHNIQUE: Multiplanar, multi-sequence magnetic resonance imaging of the lumbosacral plexus was performed before and after intravenous administration of gadolinium-based contrast.     FINDINGS:   No spinal dysraphism  identified. Normal position of the conus, which is incompletely assessed on this study. No intrathecal mass identified.   Mild disc desiccation and height loss at L4-L5 and L5-S1. A broad-based disc bulge at L4-L5 contributes to mild narrowing of the spinal canal and crowding of the subarticular zones.   Unremarkable appearance of the nerve roots of the lumbosacral plexus.   Moderate rectal stool burden.     IMPRESSION:     1.  No spinal dysraphism identified. 2.  Unremarkable appearance of the nerve roots of the lumbosacral plexus. 3.  Mild degenerative disc disease at L4-L5 and L5-S1 with a broad-based disc bulge at L4-L5 resulting in mild narrowing of the spinal canal. I personally reviewed imaging  findings ____________________________________________________________________ Relevant Labs Reviewed:   No results found for: CREATININE  No results found for: WBC, HGB, HCT, MCV, PLT  No results found for: HGBA1C ____________________________________________________________________  Diagnosis: 1. Chronic pain syndrome      2. Other idiopathic scoliosis, unspecified spinal region         Assessment and Plan: Deny Chevez is a 23 y.o. old male for follow-upp of his chronic thoracolumbar axial low back pain in the setting of scoliosis.  We had a good discussion on the treatment plan moving forward.  The patient has trialed multiple NSAIDs including naproxen, ibuprofen, meloxicam and most recently diclofenac.  They have not provided long-term pain relief.  We made a shared decision to proceed with a trial of Celebrex 100 mg twice a day as needed for pain.  I did discuss with the father that typically do not include long-term opioid therapy in my treatment armamentarium which she was understanding of.  We made a shared decision to proceed with a referral to [facility] medical for evaluation of long-term pharmacological management to potentially include opioid therapy.  Medications: PDMP reviewed. As above Discussed medication risks, benefits, alternatives, and side effects of medication at length. Educational sheets provided and reviewed. All of patient's questions and concerns addressed.  Procedural Interventions: Do not recommend any interventional procedures at this time  Physical Therapy: Referral placed to [facility]   Imaging/Diagnostic Studies: pertinent imaging reviewed and discussed with patient.   Follow up: 6 months  Treatment plan fully discussed and agreed upon with the patient. All questions were answered.  Given the patient's complex chronic pain symptoms and medical history, the patient will require continued follow-up in a longitudinal  fashion.   Treatment plan fully discussed and agreed upon with the patient. All questions were welcomed and answered. Medical decision making for this patient was moderately complex given the patient's preexisting comorbidities, chronicity of the patient's current pathology, review of relevant records from other healthcare providers, independent interpretation of imaging and/or lab results if appropriate, discussion regarding risks and benefits of proceeding forward with or holding off on scheduling a minor procedure if appropriate and designated above, the severity/progression of the pathology discussed, specific medication management if reviewed, discussion regarding financial implications of treatment, and/or the risk of complication and/or morbidity that may exist with or without treatment.  Components of this note were completed via Dragon [employer] One Voice dictation software.  Electronically signed by: Shelly Charity, MD 05/26/2024 11:09 PM   *Some images could not be shown.

## 2024-06-17 ENCOUNTER — Encounter (HOSPITAL_COMMUNITY): Payer: Self-pay

## 2024-06-17 ENCOUNTER — Emergency Department (HOSPITAL_COMMUNITY): Payer: MEDICAID

## 2024-06-17 ENCOUNTER — Emergency Department (HOSPITAL_COMMUNITY)
Admission: EM | Admit: 2024-06-17 | Discharge: 2024-06-17 | Disposition: A | Discharge status: Home / Self Care | Payer: MEDICAID | Attending: Emergency Medicine | Admitting: Emergency Medicine

## 2024-06-17 DIAGNOSIS — E119 Type 2 diabetes mellitus without complications: Secondary | ICD-10-CM | POA: Diagnosis not present

## 2024-06-17 DIAGNOSIS — Z7984 Long term (current) use of oral hypoglycemic drugs: Secondary | ICD-10-CM | POA: Insufficient documentation

## 2024-06-17 DIAGNOSIS — J45909 Unspecified asthma, uncomplicated: Secondary | ICD-10-CM | POA: Diagnosis not present

## 2024-06-17 DIAGNOSIS — R569 Unspecified convulsions: Secondary | ICD-10-CM | POA: Diagnosis present

## 2024-06-17 LAB — CBC WITH DIFFERENTIAL/PLATELET
Abs Immature Granulocytes: 0.1 K/uL — ABNORMAL HIGH (ref 0.00–0.07)
Basophils Absolute: 0 K/uL (ref 0.0–0.1)
Basophils Relative: 1 %
Eosinophils Absolute: 0.1 K/uL (ref 0.0–0.5)
Eosinophils Relative: 1 %
HCT: 39.7 % (ref 39.0–52.0)
Hemoglobin: 13.4 g/dL (ref 13.0–17.0)
Immature Granulocytes: 2 %
Lymphocytes Relative: 38 %
Lymphs Abs: 2 K/uL (ref 0.7–4.0)
MCH: 30.5 pg (ref 26.0–34.0)
MCHC: 33.8 g/dL (ref 30.0–36.0)
MCV: 90.2 fL (ref 80.0–100.0)
Monocytes Absolute: 0.6 K/uL (ref 0.1–1.0)
Monocytes Relative: 11 %
Neutro Abs: 2.6 K/uL (ref 1.7–7.7)
Neutrophils Relative %: 47 %
Platelets: 257 K/uL (ref 150–400)
RBC: 4.4 MIL/uL (ref 4.22–5.81)
RDW: 12.3 % (ref 11.5–15.5)
WBC: 5.4 K/uL (ref 4.0–10.5)
nRBC: 0 % (ref 0.0–0.2)

## 2024-06-17 LAB — BASIC METABOLIC PANEL WITH GFR
Anion gap: 10 (ref 5–15)
BUN: 11 mg/dL (ref 6–20)
CO2: 18 mmol/L — ABNORMAL LOW (ref 22–32)
Calcium: 9.3 mg/dL (ref 8.9–10.3)
Chloride: 110 mmol/L (ref 98–111)
Creatinine, Ser: 1.1 mg/dL (ref 0.61–1.24)
GFR, Estimated: >60 mL/min (ref >60)
Glucose, Bld: 85 mg/dL (ref 70–99)
Potassium: 3.9 mmol/L (ref 3.5–5.1)
Sodium: 138 mmol/L (ref 135–145)

## 2024-06-17 LAB — MAGNESIUM: Magnesium: 2.2 mg/dL (ref 1.7–2.4)

## 2024-06-17 LAB — CBG MONITORING, ED: Glucose-Capillary: 84 mg/dL (ref 70–99)

## 2024-06-17 MED ORDER — LEVETIRACETAM (KEPPRA) 500 MG/5 ML ADULT IV PUSH
2000.0000 mg | Freq: Once | INTRAVENOUS | Status: AC
  Administered 2024-06-17: 2000 mg via INTRAVENOUS
  Filled 2024-06-17: qty 20

## 2024-06-17 MED ORDER — NAYZILAM 5 MG/0.1ML NA SOLN: NASAL | 2 refills | Status: AC

## 2024-06-17 NOTE — ED Triage Notes (Signed)
 Pt BIB EMS from home after a witnessed seizure lasting 5 minutes, hx of seizures, sees neurologist. Sitting when it happened but hit the back of his head on the wall. 90/40 BP after blood donation today, given 500 mL of fluids with EMS. 18 LAC.
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
## 2024-06-17 NOTE — Discharge Instructions (Signed)
 It was a pleasure taking care of Stephen Shaw today.  Make sure to follow-up with the neurologist  Return for any worsening symptoms

## 2024-06-18 LAB — TOPIRAMATE LEVEL: Topiramate Lvl: 13.5 ug/mL (ref 2.0–25.0)

## 2024-06-19 LAB — LEVETIRACETAM LEVEL: Levetiracetam Lvl: <2 ug/mL — ABNORMAL LOW (ref 10.0–40.0)

## 2024-06-21 ENCOUNTER — Other Ambulatory Visit (INDEPENDENT_AMBULATORY_CARE_PROVIDER_SITE_OTHER): Payer: Self-pay | Admitting: Neurology

## 2024-06-21 DIAGNOSIS — G40909 Epilepsy, unspecified, not intractable, without status epilepticus: Secondary | ICD-10-CM

## 2024-07-29 ENCOUNTER — Telehealth (INDEPENDENT_AMBULATORY_CARE_PROVIDER_SITE_OTHER): Payer: Self-pay | Admitting: Neurology

## 2024-07-29 NOTE — Telephone Encounter (Signed)
  Name of who is calling: tiffany lucas   Caller's Relationship to Patient: Nurse pra  Best contact number: [phone]  Provider they see: nab   Reason for call: tiffany called and stated she saw pt today and his dad had mention to know if changes are being made to his medications dad is wanting to know what is needing to be regarding his plan for if a seizure were to happened. Tiffany said she doesn't need a call back necessarily but dad does [phone]     PRESCRIPTION REFILL ONLY  Name of prescription:  Pharmacy:

## 2024-07-29 NOTE — Telephone Encounter (Signed)
 Called dad back about message that was sent. He stated he had called the PA from the hospital 2 and they never returned his call. He wanted to know what to do if patient had an seizure. I informed dad to continue to take medication as prescribed and if seizures happen longer than 5 mins then to use the emergency medication. I let him know that he does have an appointment with us  10/27 and if he has any other questions or concerns to please give us  a call back.  Dad understood message

## 2024-08-26 ENCOUNTER — Emergency Department (HOSPITAL_COMMUNITY)
Admission: EM | Admit: 2024-08-26 | Discharge: 2024-08-26 | Disposition: A | Discharge status: Home / Self Care | Payer: MEDICAID | Attending: Emergency Medicine | Admitting: Emergency Medicine

## 2024-08-26 ENCOUNTER — Other Ambulatory Visit: Payer: Self-pay

## 2024-08-26 DIAGNOSIS — R569 Unspecified convulsions: Secondary | ICD-10-CM | POA: Diagnosis present

## 2024-08-26 DIAGNOSIS — E119 Type 2 diabetes mellitus without complications: Secondary | ICD-10-CM | POA: Insufficient documentation

## 2024-08-26 LAB — CBC WITH DIFFERENTIAL/PLATELET
Abs Immature Granulocytes: 0.04 K/uL (ref 0.00–0.07)
Basophils Absolute: 0 K/uL (ref 0.0–0.1)
Basophils Relative: 1 %
Eosinophils Absolute: 0 K/uL (ref 0.0–0.5)
Eosinophils Relative: 1 %
HCT: 40.1 % (ref 39.0–52.0)
Hemoglobin: 13.2 g/dL (ref 13.0–17.0)
Immature Granulocytes: 1 %
Lymphocytes Relative: 33 %
Lymphs Abs: 2.2 K/uL (ref 0.7–4.0)
MCH: 30.1 pg (ref 26.0–34.0)
MCHC: 32.9 g/dL (ref 30.0–36.0)
MCV: 91.3 fL (ref 80.0–100.0)
Monocytes Absolute: 0.7 K/uL (ref 0.1–1.0)
Monocytes Relative: 11 %
Neutro Abs: 3.6 K/uL (ref 1.7–7.7)
Neutrophils Relative %: 53 %
Platelets: 268 K/uL (ref 150–400)
RBC: 4.39 MIL/uL (ref 4.22–5.81)
RDW: 13 % (ref 11.5–15.5)
WBC: 6.6 K/uL (ref 4.0–10.5)
nRBC: 0 % (ref 0.0–0.2)

## 2024-08-26 LAB — COMPREHENSIVE METABOLIC PANEL WITH GFR
ALT: 15 U/L (ref 0–44)
AST: 23 U/L (ref 15–41)
Albumin: 3.7 g/dL (ref 3.5–5.0)
Alkaline Phosphatase: 55 U/L (ref 38–126)
Anion gap: 13 (ref 5–15)
BUN: 13 mg/dL (ref 6–20)
CO2: 16 mmol/L — ABNORMAL LOW (ref 22–32)
Calcium: 8.7 mg/dL — ABNORMAL LOW (ref 8.9–10.3)
Chloride: 110 mmol/L (ref 98–111)
Creatinine, Ser: 1.38 mg/dL — ABNORMAL HIGH (ref 0.61–1.24)
GFR, Estimated: >60 mL/min (ref >60)
Glucose, Bld: 104 mg/dL — ABNORMAL HIGH (ref 70–99)
Potassium: 4.2 mmol/L (ref 3.5–5.1)
Sodium: 139 mmol/L (ref 135–145)
Total Bilirubin: 0.4 mg/dL (ref 0.0–1.2)
Total Protein: 6.7 g/dL (ref 6.5–8.1)

## 2024-08-26 LAB — MAGNESIUM: Magnesium: 2.1 mg/dL (ref 1.7–2.4)

## 2024-08-26 LAB — CBG MONITORING, ED: Glucose-Capillary: 94 mg/dL (ref 70–99)

## 2024-08-26 LAB — ETHANOL: Alcohol, Ethyl (B): <15 mg/dL (ref <15)

## 2024-08-26 NOTE — ED Provider Notes (Signed)
 [place] EMERGENCY DEPARTMENT AT [facility] Provider Note   CSN: [id number] Arrival date & time: 08/26/24  0119     Patient presents with: Seizures   Stephen Shaw is a 23 y.o. male past medical history significant for diabetes and seizures presents today for witnessed seizure at home.  Patient given 5 mg intranasal Versed by EMS.  Patient denies tongue bite or loss of bowel or bladder.  Patient is alert and oriented to self, place, and situation but not time.    Seizures      Prior to Admission medications   Medication Sig Start Date End Date Taking? Authorizing Provider  albuterol  (PROVENTIL ) (2.5 MG/3ML) 0.083% nebulizer solution Take 2.5 mg by nebulization every 6 (six) hours as needed for wheezing or shortness of breath. For shortness of breath/wheezing.    [provider]  budesonide (PULMICORT) 1 MG/2ML nebulizer solution Take 1 mg by nebulization daily.    [provider]  CETIRIZINE HCL CHILDRENS ALRGY 1 MG/ML SOLN Take 10 mLs by mouth daily. 05/15/23   [provider]  clindamycin-benzoyl peroxide (BENZACLIN) gel Apply 1 application topically See admin instructions. Apply to affected areas of the face as directed in the evening/wash face 2 times a day Patient not taking: Reported on 12/22/2023 03/16/18   [provider]  cloBAZam  (ONFI ) 10 MG tablet TAKE 2 TABLETS (=20mg ) BY MOUTH 2 TIMES DAILY 06/21/24   Corinthia Blossom, MD  fluticasone (FLONASE) 50 MCG/ACT nasal spray Place 2 sprays into both nostrils in the morning.    [provider]  HEALTHYLAX 17 g packet Take 34 g by mouth every 3 (three) days. Patient not taking: Reported on 05/22/2023    [provider]  hydrOXYzine (ATARAX) 10 MG/5ML syrup Take 30 mg by mouth at bedtime. 03/16/18   [provider]  metFORMIN (GLUCOPHAGE) 500 MG tablet Take 500 mg by mouth 2 (two) times daily with a meal. 05/01/23   [provider]  montelukast (SINGULAIR)  5 MG chewable tablet Chew 10 mg by mouth in the morning.    [provider]  naproxen (NAPROSYN) 375 MG tablet Take 375 mg by mouth 2 (two) times daily as needed (for pain). 07/23/21   [provider]  NAYZILAM  5 MG/0.1ML SOLN Apply 5 mg nasally for seizures lasting longer than 5 minutes 06/17/24   Henderly, Britni A, PA-C  ondansetron  (ZOFRAN  ODT) 4 MG disintegrating tablet Take 1 tablet (4 mg total) by mouth every 8 (eight) hours as needed for nausea or vomiting. Patient not taking: Reported on 12/22/2023 06/23/18   Peri Glendia ORN, MD  PROAIR  HFA 108 [phone] Base) MCG/ACT inhaler Inhale 2 puffs into the lungs every 4 (four) hours as needed for wheezing or shortness of breath.    [provider]  SYMBICORT 160-4.5 MCG/ACT inhaler Inhale 2 puffs into the lungs in the morning. 03/16/18   [provider]  topiramate  (TOPAMAX ) 200 MG tablet Take 1.5 tablets or 300 mg twice daily 12/22/23   Corinthia Blossom, MD  VITAMIN D3 SUPER STRENGTH 50 MCG (2000 UT) CAPS Take 2,000 Units by mouth daily.    [provider]    Allergies: American cockroach, Other, and Timothy grass pollen allergen    Review of Systems  Neurological:  Positive for seizures.    Updated Vital Signs BP 115/80   Pulse 88   Temp 98.6 F (37 C)   Resp (!) 21   SpO2 100%   Physical Exam Vitals and nursing  note reviewed.  Constitutional:      General: He is not in acute distress.    Appearance: Normal appearance. He is well-developed. He is not ill-appearing.     Comments: Drowsy  HENT:     Head: Normocephalic and atraumatic.     Right Ear: External ear normal.     Left Ear: External ear normal.     Nose: Nose normal.  Eyes:     Conjunctiva/sclera: Conjunctivae normal.  Cardiovascular:     Rate and Rhythm: Normal rate and regular rhythm.     Heart sounds: No murmur heard. Pulmonary:     Effort: Pulmonary effort is normal. No respiratory distress.     Breath sounds: Normal breath  sounds.  Abdominal:     Palpations: Abdomen is soft.     Tenderness: There is no abdominal tenderness.  Musculoskeletal:        General: No swelling.     Cervical back: Neck supple.  Skin:    General: Skin is warm and dry.     Capillary Refill: Capillary refill takes less than 2 seconds.  Psychiatric:        Mood and Affect: Mood normal.     (all labs ordered are listed, but only abnormal results are displayed) Labs Reviewed  COMPREHENSIVE METABOLIC PANEL WITH GFR - Abnormal; Notable for the following components:      Result Value   CO2 16 (*)    Glucose, Bld 104 (*)    Creatinine, Ser 1.38 (*)    Calcium  8.7 (*)    All other components within normal limits  CBC WITH DIFFERENTIAL/PLATELET  ETHANOL  MAGNESIUM  RAPID URINE DRUG SCREEN, HOSP PERFORMED  TOPIRAMATE  LEVEL  CBG MONITORING, ED    EKG: None  Radiology: No results found.   Procedures   Medications Ordered in the ED - No data to display                                  Medical Decision Making Amount and/or Complexity of Data Reviewed Labs: ordered.   This patient presents to the ED for concern of seizure differential diagnosis includes electrolyte abnormality, anemia, seizure, med noncompliance, hypoglycemia, hyperglycemia    Additional history obtained   Additional history obtained from Electronic Medical Record External records from outside source obtained and reviewed including neurology notes   Lab Tests:  I Ordered, and personally interpreted labs.  The pertinent results include: CBC unremarkable, glucose 94, creatinine 1.38 from a baseline of approximately 1.15, reduced CO2 at 16, negative ethanol, magnesium 2.1  Problem List / ED Course:  Consulted neurology, Dr. Vanessa who recommended increasing nighttime Onfi  to 30 mg and having the patient call their neurologist in the morning for follow-up.  Patient has had no continued seizure activity while in the emergency department.   Patient is back to his baseline and is alert and oriented.  Patient's medication has been changed as advised by neurology.  Patient given return precautions.  I feel patient safe for discharge at this time.     Final diagnoses:  Seizure [facility])    ED Discharge Orders     None          Allyse Fregeau N, PA-C 08/26/24 0341    Lorette Mayo, MD 08/26/24 [phone]

## 2024-08-26 NOTE — Discharge Instructions (Addendum)
 Today you were seen for breakthrough seizure.  Please increase your nighttime Onfi  to 3 tablets or 30 mg.  Continue taking 20 mg or 2 tablets of Onfi  in the morning.  Please call your neurologist in the morning and schedule follow-up.  Please return to the ED if you have any continued seizure-like activity.   Please do not drive until you are seizure-free for 6 months.  Thank you for letting us  treat you today. After reviewing your labs and imaging, I feel you are safe to go home. Please follow up with your PCP in the next several days and provide them with your records from this visit. Return to the Emergency Room if pain becomes severe or symptoms worsen.

## 2024-08-26 NOTE — ED Triage Notes (Addendum)
 Patient arrived with EMS from home with his father, witnessed grand mal seizure activity x1 this evening at home by father , received Versed 5 mg intranasal by EMS and NS 500 ml IV prior to arrival . CBG=110. Alert and oriented at arrival .

## 2024-08-29 LAB — TOPIRAMATE LEVEL: Topiramate Lvl: 14.8 ug/mL (ref 2.0–25.0)

## 2024-09-10 ENCOUNTER — Other Ambulatory Visit (INDEPENDENT_AMBULATORY_CARE_PROVIDER_SITE_OTHER): Payer: Self-pay | Admitting: Neurology

## 2024-09-10 DIAGNOSIS — G40909 Epilepsy, unspecified, not intractable, without status epilepticus: Secondary | ICD-10-CM

## 2024-09-27 ENCOUNTER — Encounter (INDEPENDENT_AMBULATORY_CARE_PROVIDER_SITE_OTHER): Payer: Self-pay | Admitting: Neurology

## 2024-09-27 ENCOUNTER — Ambulatory Visit (INDEPENDENT_AMBULATORY_CARE_PROVIDER_SITE_OTHER): Payer: MEDICAID | Admitting: Neurology

## 2024-09-27 VITALS — BP 118/74 | HR 74 | Ht 72.0 in | Wt 220.2 lb

## 2024-09-27 DIAGNOSIS — G40909 Epilepsy, unspecified, not intractable, without status epilepticus: Secondary | ICD-10-CM | POA: Diagnosis not present

## 2024-09-27 DIAGNOSIS — F840 Autistic disorder: Secondary | ICD-10-CM

## 2024-09-27 DIAGNOSIS — F79 Unspecified intellectual disabilities: Secondary | ICD-10-CM | POA: Diagnosis not present

## 2024-09-27 DIAGNOSIS — Q753 Macrocephaly: Secondary | ICD-10-CM | POA: Diagnosis not present

## 2024-09-27 MED ORDER — TOPIRAMATE 200 MG PO TABS: ORAL_TABLET | ORAL | 9 refills | Status: AC

## 2024-09-27 MED ORDER — CLOBAZAM 20 MG PO TABS: 20.0000 mg | ORAL_TABLET | Freq: Two times a day (BID) | ORAL | 5 refills | Status: AC

## 2024-09-27 NOTE — Patient Instructions (Signed)
 Continue the same dose of Topamax  at 1.5 tablet twice daily Continue the same dose of Onfi  at 1 tablet of 20 mg twice daily Continue with adequate sleep and limited screen time Have nasal spray available in case of prolonged seizure activity Return in 6 months for follow-up visit

## 2024-09-27 NOTE — Progress Notes (Signed)
 Patient: Stephen Shaw MRN: [id number] Sex: male DOB: 2001/10/18  Provider: Norwood Abu, MD Location of Care: [facility]  Note type: Routine return visit  Referral Source: Doreene Maude PARAS, MD  History from: [facility] chart Chief Complaint: follow up  History of Present Illness: Stephen Shaw is a 23 y.o. male is here for follow-up management of seizure disorder. He has a diagnosis of autism spectrum disorder, microcephaly, developmental delay/intellectual disability and seizure disorder, on 2 AEDs with no side effects but he has been having episodes of breakthrough seizures every 1 to 2 months for which he may need to go to the emergency room. The last episode of breakthrough seizure was last month that lasted for 4 or 5 minutes and needed Nayzilam  to control the seizure and then he was taken to the emergency room since he was postictal.  Following the last seizure the dose of Onfi  increased from 15 mg to 20 mg twice daily. He has been taking Topamax  300 mg twice daily and now he is taking Onfi  20 mg twice daily, tolerating well with no side effects.  He usually sleeps well through the night with no awakening.  He has been taking medication regularly without any missing doses.  He has not been on any other new medication.  He does have Nayzilam  as a rescue medication in case of prolonged seizure activity. Father did not find any triggers for the seizures such as lack of sleep, any sickness or high fever or skipping any dose of medication. He has had normal EEGs in the past with the last EEG was in 2019.  Also he had a prolonged video EEG in March 2022 with normal result. 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   Review of Systems: Review of system as per HPI, otherwise negative.  Past Medical History:  Diagnosis Date   Asthma    Autism    Back pain    Down syndrome     Memory loss    Scoliosis    Seizures (HCC)    Type 2 diabetes mellitus (HCC)    Vision abnormalities    Hospitalizations: No., Head Injury: No., Nervous System Infections: No., Immunizations up to date: Yes.    Surgical History History reviewed. No pertinent surgical history.  Family History family history includes Heart Problems in an other family member; Seizures in an other family member.   Social History Social History   Socioeconomic History   Marital status: Single    Spouse name: Not on file   Number of children: Not on file   Years of education: Not on file   Highest education level: Not on file  Occupational History   Not on file  Tobacco Use   Smoking status: Never    Passive exposure: Never   Smokeless tobacco: Never  Vaping Use   Vaping status: Never Used  Substance and Sexual Activity   Alcohol use: Never   Drug use: Never   Sexual activity: Never  Other Topics Concern   Not on file  Social History Narrative   Lives with dad at home. Not in school. Dad stated he graduated.   Dad needs a note for land lord to change to a 1 st floor apt due to patients spine issues and seizures.   Social Drivers of Health   Financial Resource Strain: Not at Risk (07/29/2024)   Received from [facility]  Financial Resource Strain    How hard is it for you to pay for the very basics like food, housing, heating, medical care, and medications?: 1  Food Insecurity: At Risk (07/29/2024)   Received from [facility] Insecurity    Within the past 12 months, you worried that your food would run out before you got money to buy more.: 2  Transportation Needs: Not at Risk (07/29/2024)   Received from [facility] Needs    In the past 12 months, has lack of transportation kept you from medical appointments, meetings, work or from getting things needed for daily living?: 1  Physical Activity: Not on File (03/21/2022)   Received from [facility]   Physical Activity    Physical  Activity: 0  Stress: Not on File (03/21/2022)   Received from [facility]   Stress    Stress: 0  Social Connections: Not on File (08/07/2023)   Received from [facility]   Social Connections    Connectedness: 0     Allergies  Allergen Reactions   American Cockroach    Other Itching and Other (See Comments)    Tree's Box Elder   Timothy Grass Pollen Allergen Itching    Physical Exam BP 118/74   Pulse 74   Ht 6' (1.829 m)   Wt 220 lb 3.8 oz (99.9 kg)   BMI 29.87 kg/m  Gen: Awake, alert, not in distress, Non-toxic appearance. Skin: No neurocutaneous stigmata, no rash HEENT: Macrocephalic, no dysmorphic features, no conjunctival injection, nares patent, mucous membranes moist, oropharynx clear. Neck: Supple, no meningismus, no lymphadenopathy,  Resp: Clear to auscultation bilaterally CV: Regular rate, normal S1/S2, no murmurs, no rubs Abd: Bowel sounds present, abdomen soft, non-tender, non-distended.  No hepatosplenomegaly or mass. Ext: Warm and well-perfused. No deformity, no muscle wasting, ROM full.  Neurological Examination: MS- Awake, alert, interactive and follows instructions but with decreased eye contact and nonverbal Cranial Nerves- Pupils equal, round and reactive to light (5 to 3mm); fix and follows with full and smooth EOM; no nystagmus; no ptosis, funduscopy with normal sharp discs, visual field full by looking at the toys on the side, face symmetric with smile.  Hearing intact to bell bilaterally, palate elevation is symmetric, and tongue protrusion is symmetric. Tone- Normal Strength-Seems to have good strength, symmetrically by observation and passive movement. Reflexes-    Biceps Triceps Brachioradialis Patellar Ankle  R 2+ 2+ 2+ 2+ 2+  L 2+ 2+ 2+ 2+ 2+   Plantar responses flexor bilaterally, no clonus noted Sensation- Withdraw at four limbs to stimuli. Coordination- Reached to the object with no dysmetria Gait: Normal walk without any coordination or balance  issues.   Assessment and Plan 1. Seizure disorder (HCC)   2. Macrocephaly   3. Autism spectrum disorder    This is a 23 year old male with autism spectrum disorder, microcephaly, developmental delay/intellectual disability and seizure disorder with breakthrough seizures every couple of months but with normal EEGs and a brain MRI which showed left hippocampal area hyperintensity with possibility of mesial temporal sclerosis.  He has no focal findings on his neurological examination Since his last EEG was in 2020, I would recommend to schedule for a sleep deprived EEG for further evaluation. If he continues having more seizure with normal EEG then we may repeat prolonged ambulatory EEG at home We will continue the same dose of Topamax  at 300 mg twice daily We will continue the same dose of Onfi  at 20 mg twice daily which recently  increased after his last seizure and I will send a new prescription for the 20 mg tablet to take 1 tablet twice daily He does have nasal spray as a rescue medication in case of prolonged seizure activity If he continues having more seizure or if his EEG showed focal discharges then I may start him on another medication which would be either Vimpat or Trileptal. I would like to see him in 6 months for follow-up visit but I will call father with the results of EEG and then decide regarding medication adjustment or prolonged video EEG.  Father understood and agreed with the plan.  I spent 40 minutes with patient and his father, more than 50% time spent for counseling and coordination of care.    Meds ordered this encounter  Medications   topiramate  (TOPAMAX ) 200 MG tablet    Sig: Take 1.5 tablets or 300 mg twice daily    Dispense:  90 tablet    Refill:  9   cloBAZam  (ONFI ) 20 MG tablet    Sig: Take 1 tablet (20 mg total) by mouth 2 (two) times daily.    Dispense:  60 tablet    Refill:  5   Orders Placed This Encounter  Procedures   Child sleep deprived EEG     Standing Status:   Future    Expiration Date:   09/27/2025

## 2024-10-14 ENCOUNTER — Other Ambulatory Visit (INDEPENDENT_AMBULATORY_CARE_PROVIDER_SITE_OTHER): Payer: MEDICAID

## 2024-10-27 ENCOUNTER — Ambulatory Visit (INDEPENDENT_AMBULATORY_CARE_PROVIDER_SITE_OTHER): Payer: MEDICAID | Admitting: Neurology

## 2024-10-27 DIAGNOSIS — G40909 Epilepsy, unspecified, not intractable, without status epilepticus: Secondary | ICD-10-CM | POA: Diagnosis not present

## 2024-10-27 NOTE — Progress Notes (Signed)
 Routine EEG complete. Results pending.

## 2024-10-27 NOTE — Procedures (Signed)
 Patient:  Stephen Shaw   Sex: male  DOB:  January 21, 2001  Date of study: 10/27/2024                Clinical history: This is a 23 year old male with diagnosis of autism spectrum disorder, macrocephaly, developmental delay and intellectual disability as well as seizure disorder.  His previous EEGs were all normal with the last 1 in 2022.  This is a follow-up EEG for evaluation of epileptiform discharges.  Medication:    Topamax , Onfi            Procedure: The tracing was carried out on a 32 channel digital Cadwell recorder reformatted into 16 channel montages with 1 devoted to EKG.  The 10 /20 international system electrode placement was used. Recording was done during awake state. Recording time 38 minutes.   Description of findings: Background rhythm consists of amplitude of 50 microvolt and frequency of 9-10 hertz posterior dominant rhythm. There was normal anterior posterior gradient noted. Background was well organized, continuous and symmetric with no focal slowing. There were occasional muscle and movement artifacts as well as blinking artifacts noted.   Hyperventilation resulted in slowing of the background activity. Photic stimulation using stepwise increase in photic frequency resulted in bilateral symmetric driving response. Throughout the recording there were no focal or generalized epileptiform activities in the form of spikes or sharps noted. There were no transient rhythmic activities or electrographic seizures noted. One lead EKG rhythm strip revealed sinus rhythm at a rate of 60 bpm.  Impression: This EEG is normal during awake state. Please note that normal EEG does not exclude epilepsy, clinical correlation is indicated.      Norwood Abu, MD

## 2024-12-08 ENCOUNTER — Other Ambulatory Visit: Payer: Self-pay

## 2024-12-08 ENCOUNTER — Emergency Department (HOSPITAL_COMMUNITY): Payer: MEDICAID

## 2024-12-08 ENCOUNTER — Encounter (HOSPITAL_COMMUNITY): Payer: Self-pay | Admitting: Emergency Medicine

## 2024-12-08 ENCOUNTER — Emergency Department (HOSPITAL_COMMUNITY)
Admission: EM | Admit: 2024-12-08 | Discharge: 2024-12-08 | Disposition: A | Discharge status: Home / Self Care | Payer: MEDICAID | Attending: Emergency Medicine | Admitting: Emergency Medicine

## 2024-12-08 DIAGNOSIS — E119 Type 2 diabetes mellitus without complications: Secondary | ICD-10-CM | POA: Insufficient documentation

## 2024-12-08 DIAGNOSIS — S0990XA Unspecified injury of head, initial encounter: Secondary | ICD-10-CM | POA: Diagnosis present

## 2024-12-08 DIAGNOSIS — G40909 Epilepsy, unspecified, not intractable, without status epilepticus: Secondary | ICD-10-CM | POA: Diagnosis not present

## 2024-12-08 DIAGNOSIS — W19XXXA Unspecified fall, initial encounter: Secondary | ICD-10-CM | POA: Insufficient documentation

## 2024-12-08 DIAGNOSIS — Z23 Encounter for immunization: Secondary | ICD-10-CM | POA: Diagnosis not present

## 2024-12-08 DIAGNOSIS — S0181XA Laceration without foreign body of other part of head, initial encounter: Secondary | ICD-10-CM | POA: Diagnosis not present

## 2024-12-08 DIAGNOSIS — Z79899 Other long term (current) drug therapy: Secondary | ICD-10-CM | POA: Diagnosis not present

## 2024-12-08 DIAGNOSIS — Z7984 Long term (current) use of oral hypoglycemic drugs: Secondary | ICD-10-CM | POA: Diagnosis not present

## 2024-12-08 DIAGNOSIS — J4540 Moderate persistent asthma, uncomplicated: Secondary | ICD-10-CM | POA: Insufficient documentation

## 2024-12-08 DIAGNOSIS — F840 Autistic disorder: Secondary | ICD-10-CM | POA: Insufficient documentation

## 2024-12-08 DIAGNOSIS — Z7951 Long term (current) use of inhaled steroids: Secondary | ICD-10-CM | POA: Diagnosis not present

## 2024-12-08 LAB — CBC WITH DIFFERENTIAL/PLATELET
Abs Immature Granulocytes: 0.08 K/uL — ABNORMAL HIGH (ref 0.00–0.07)
Basophils Absolute: 0 K/uL (ref 0.0–0.1)
Basophils Relative: 1 %
Eosinophils Absolute: 0.1 K/uL (ref 0.0–0.5)
Eosinophils Relative: 1 %
HCT: 42.2 % (ref 39.0–52.0)
Hemoglobin: 14.2 g/dL (ref 13.0–17.0)
Immature Granulocytes: 1 %
Lymphocytes Relative: 38 %
Lymphs Abs: 2.2 K/uL (ref 0.7–4.0)
MCH: 30.5 pg (ref 26.0–34.0)
MCHC: 33.6 g/dL (ref 30.0–36.0)
MCV: 90.8 fL (ref 80.0–100.0)
Monocytes Absolute: 0.6 K/uL (ref 0.1–1.0)
Monocytes Relative: 11 %
Neutro Abs: 2.7 K/uL (ref 1.7–7.7)
Neutrophils Relative %: 48 %
Platelets: 283 K/uL (ref 150–400)
RBC: 4.65 MIL/uL (ref 4.22–5.81)
RDW: 12.8 % (ref 11.5–15.5)
WBC: 5.7 K/uL (ref 4.0–10.5)
nRBC: 0 % (ref 0.0–0.2)

## 2024-12-08 LAB — COMPREHENSIVE METABOLIC PANEL WITH GFR
ALT: 15 U/L (ref 0–44)
AST: 24 U/L (ref 15–41)
Albumin: 4.5 g/dL (ref 3.5–5.0)
Alkaline Phosphatase: 69 U/L (ref 38–126)
Anion gap: 13 (ref 5–15)
BUN: 12 mg/dL (ref 6–20)
CO2: 17 mmol/L — ABNORMAL LOW (ref 22–32)
Calcium: 9.1 mg/dL (ref 8.9–10.3)
Chloride: 110 mmol/L (ref 98–111)
Creatinine, Ser: 1.21 mg/dL (ref 0.61–1.24)
GFR, Estimated: >60 mL/min
Glucose, Bld: 91 mg/dL (ref 70–99)
Potassium: 4.5 mmol/L (ref 3.5–5.1)
Sodium: 139 mmol/L (ref 135–145)
Total Bilirubin: 0.2 mg/dL (ref 0.0–1.2)
Total Protein: 7.4 g/dL (ref 6.5–8.1)

## 2024-12-08 LAB — MAGNESIUM: Magnesium: 2.5 mg/dL — ABNORMAL HIGH (ref 1.7–2.4)

## 2024-12-08 MED ORDER — LIDOCAINE-EPINEPHRINE-TETRACAINE (LET) TOPICAL GEL
3.0000 mL | Freq: Once | TOPICAL | Status: AC
  Administered 2024-12-08: 3 mL via TOPICAL
  Filled 2024-12-08: qty 3

## 2024-12-08 MED ORDER — TETANUS-DIPHTH-ACELL PERTUSSIS 5-2-15.5 LF-MCG/0.5 IM SUSP
0.5000 mL | Freq: Once | INTRAMUSCULAR | Status: AC
  Administered 2024-12-08: 0.5 mL via INTRAMUSCULAR
  Filled 2024-12-08: qty 0.5

## 2024-12-08 NOTE — ED Triage Notes (Addendum)
 Pt BIB [facility] coming from home because of seizure that resulted in a fall; unresponsive for some minutes with snoring respirations on Fire arrival. No thinners. Lac on R eyebrow. C collar by EMS removed by EDP on arrival.; Patient lethargic on arrival and slow to respond.  Hx epilepsy   EMS: 98/58 116 HR 98% RA 97 CBG

## 2024-12-08 NOTE — ED Provider Notes (Signed)
 Care transferred to me.  Patient's vital signs are currently unremarkable.  He has not had any further seizure-like activity.  Dr. Garnette spoke with neurology who indicated no adjustment in his meds for now and can follow-up as an outpatient with his neurologist.  Patient appears stable for discharge home with return precautions.  Explained all this to the father.   Freddi Hamilton, MD 12/08/24 [phone]

## 2024-12-08 NOTE — ED Provider Notes (Addendum)
 " [facility] AT [facility] Provider Note  CSN: [id number] Arrival date & time: 12/08/24 1400  Chief Complaint(s) Seizures and Fall  HPI Stephen Shaw is a 24 y.o. male with history of epilepsy, autism, microcephaly, developmental delay patient takes Onfi , 20 mg twice daily, Topamax  30 mg twice daily.  Had a witnessed seizure today.  Father is at bedside.  Patient had seizure, fell down and struck his head.  Blood sugar normal.  Did not require any rescue medications.  Has been compliant with medications.  Reviewed the patient's most recent outpatient neurology note.   Past Medical History Past Medical History:  Diagnosis Date   Asthma    Autism    Back pain    Down syndrome    Memory loss    Scoliosis    Seizures (HCC)    Type 2 diabetes mellitus (HCC)    Vision abnormalities    Patient Active Problem List   Diagnosis Date Noted   Acute cystitis without hematuria 11/21/2020   Enuresis 11/21/2020   Slow transit constipation 11/21/2020   Urine malodor 04/07/2019   Asthma, allergic, moderate persistent, uncomplicated 02/02/2019   Scoliosis 02/02/2019   Seizure (HCC) 06/28/2018   Allergic rhinitis due to pollen 08/12/2017   Extrinsic asthma 08/12/2017   Seizure-like activity (HCC) 03/09/2014   Macrocephaly 03/09/2014   Autism 03/09/2014   Simple febrile convulsions (HCC) 02/08/2014   Acne 03/24/2013   Home Medication(s) Prior to Admission medications  Medication Sig Start Date End Date Taking? Authorizing Provider  albuterol  (PROVENTIL ) (2.5 MG/3ML) 0.083% nebulizer solution Take 2.5 mg by nebulization every 6 (six) hours as needed for wheezing or shortness of breath. For shortness of breath/wheezing.    [provider]  budesonide (PULMICORT) 1 MG/2ML nebulizer solution Take 1 mg by nebulization daily.    [provider]  CETIRIZINE HCL CHILDRENS ALRGY 1 MG/ML SOLN Take 10 mLs by mouth daily. 05/15/23   [provider]  clindamycin-benzoyl peroxide (BENZACLIN) gel Apply 1 application topically See admin instructions. Apply to affected areas of the face as directed in the evening/wash face 2 times a day 03/16/18   [provider]  cloBAZam  (ONFI ) 20 MG tablet Take 1 tablet (20 mg total) by mouth 2 (two) times daily. 09/27/24   Nabizadeh, Reza, MD  fluticasone (FLONASE) 50 MCG/ACT nasal spray Place 2 sprays into both nostrils in the morning.    [provider]  HEALTHYLAX 17 g packet Take 34 g by mouth every 3 (three) days.    [provider]  hydrOXYzine (ATARAX) 10 MG/5ML syrup Take 30 mg by mouth at bedtime. 03/16/18   [provider]  metFORMIN (GLUCOPHAGE) 500 MG tablet Take 500 mg by mouth 2 (two) times daily with a meal. 05/01/23   [provider]  montelukast (SINGULAIR) 5 MG chewable tablet Chew 10 mg by mouth in the morning.    [provider]  naproxen (NAPROSYN) 375 MG tablet Take 375 mg by mouth 2 (two) times daily as needed (for pain). 07/23/21   [provider]  NAYZILAM  5 MG/0.1ML SOLN Apply 5 mg nasally for seizures lasting longer than 5 minutes 06/17/24   Henderly, Britni A, PA-C  ondansetron  (ZOFRAN  ODT) 4 MG disintegrating tablet Take 1 tablet (4 mg total) by mouth every 8 (eight) hours as needed for nausea or vomiting. 06/23/18   Peri Glendia ORN, MD  PROAIR  HFA 108 (90 Base) MCG/ACT inhaler Inhale 2 puffs into the lungs  every 4 (four) hours as needed for wheezing or shortness of breath.    [provider]  SYMBICORT 160-4.5 MCG/ACT inhaler Inhale 2 puffs into the lungs in the morning. 03/16/18   [provider]  topiramate  (TOPAMAX ) 200 MG tablet Take 1.5 tablets or 300 mg twice daily 09/27/24   Corinthia Blossom, MD  VITAMIN D3 SUPER STRENGTH 50 MCG (2000 UT) CAPS Take 2,000 Units by mouth daily.    [provider]                                                                                                                                     Past Surgical History History reviewed. No pertinent surgical history. Family History Family History  Problem Relation Age of Onset   Heart Problems Other    Seizures Other     Social History Social History[1] Allergies American cockroach, Other, and Timothy grass pollen allergen  Review of Systems Review of Systems  Physical Exam Vital Signs  I have reviewed the triage vital signs BP (!) 105/56   Pulse 96   Temp (!) 97.5 F (36.4 C)   Resp 16   Ht 6' 1 (1.854 m)   Wt 101.6 kg   SpO2 100%   BMI 29.55 kg/m   Physical Exam Vitals and nursing note reviewed.  Constitutional:      Appearance: He is not toxic-appearing.  HENT:     Head: Normocephalic.     Comments: On the forehead there is a 4.5 cm linear laceration    Mouth/Throat:     Mouth: Mucous membranes are moist.     Comments: No injury to the tongue Eyes:     Pupils: Pupils are equal, round, and reactive to light.  Cardiovascular:     Rate and Rhythm: Normal rate.  Pulmonary:     Effort: Pulmonary effort is normal.  Abdominal:     General: Abdomen is flat. There is no distension.     Palpations: Abdomen is soft.     Tenderness: There is no abdominal tenderness.  Musculoskeletal:        General: Normal range of motion.  Neurological:     General: No focal deficit present.     Mental Status: He is alert.     ED Results and Treatments Labs (all labs ordered are listed, but only abnormal results are displayed) Labs Reviewed  CBC WITH DIFFERENTIAL/PLATELET - Abnormal; Notable for the following components:      Result Value   Abs Immature Granulocytes 0.08 (*)    All other components within normal limits  COMPREHENSIVE METABOLIC PANEL WITH GFR  MAGNESIUM  Radiology CT Head Wo Contrast Result Date: 12/08/2024 CLINICAL DATA:  Seizure with  fall/head trauma. EXAM: CT HEAD WITHOUT CONTRAST TECHNIQUE: Contiguous axial images were obtained from the base of the skull through the vertex without intravenous contrast. RADIATION DOSE REDUCTION: This exam was performed according to the departmental dose-optimization program which includes automated exposure control, adjustment of the mA and/or kV according to patient size and/or use of iterative reconstruction technique. COMPARISON:  06/17/2024 FINDINGS: Brain: Ventricles, cisterns and other CSF spaces are normal. There is no mass, mass effect, shift of midline structures or acute hemorrhage. No evidence of acute infarction. Vascular: No hyperdense vessel or unexpected calcification. Skull: Normal. Negative for fracture or focal lesion. Sinuses/Orbits: Orbits are normal.  Paranasal sinuses are clear. Other: None. IMPRESSION: No acute findings. Electronically Signed   By: Toribio Agreste M.D.   On: 12/08/2024 14:53    Pertinent labs & imaging results that were available during my care of the patient were reviewed by me and considered in my medical decision making (see MDM for details).  Medications Ordered in ED Medications  lidocaine -EPINEPHrine -tetracaine  (LET) topical gel (3 mLs Topical Given 12/08/24 1428)                                                                                                                                     Procedures .Laceration Repair  Date/Time: 12/08/2024 3:07 PM  Performed by: Mannie Fairy DASEN, DO Authorized by: Mannie Fairy DASEN, DO   Consent:    Consent obtained:  Verbal   Consent given by:  Patient Universal protocol:    Patient identity confirmed:  Verbally with patient Laceration details:    Location:  Face   Face location:  Forehead   Length (cm):  4.5 Treatment:    Amount of cleaning:  Standard   Irrigation solution:  Sterile saline   Debridement:  None Skin repair:    Repair method:  Sutures   Suture size:  5-0   Suture material:   Prolene   Suture technique:  Simple interrupted   Number of sutures:  7 Approximation:    Approximation:  Close Repair type:    Repair type:  Intermediate Comments:     Patient is a 4.5 cm laceration, it is a triangular flap shaped laceration.  Closed with 7 5-0 Prolene sutures in simple interrupted fashion.  Patient tolerated procedure well.   (including critical care time)  Medical Decision Making / ED Course   This patient presents to the ED for concern of seizure, this involves an extensive number of treatment options, and is a complaint that carries with it a high risk of complications and morbidity.  The differential diagnosis includes epilepsy, ICH, forehead laceration  MDM: 24 year old male here today after seizure.  Has epilepsy, has breakthrough seizures every few months.  Likely a breakthrough seizure, will check blood work on the patient obtain imaging of the patient's head.  No  C-spine pain or tenderness.  Patient is now at his baseline.  Patient will be signed out to Dr. Franklyn pending CT imaging, blood work and disposition.   Additional history obtained: -Additional history obtained from father at bedside -External records from outside source obtained and reviewed including: Chart review including previous notes, labs, imaging, consultation notes   Lab Tests: -I ordered, reviewed, and interpreted labs.   The pertinent results include:   Labs Reviewed  CBC WITH DIFFERENTIAL/PLATELET - Abnormal; Notable for the following components:      Result Value   Abs Immature Granulocytes 0.08 (*)    All other components within normal limits  COMPREHENSIVE METABOLIC PANEL WITH GFR  MAGNESIUM      Imaging Studies ordered: I ordered imaging studies including CT head I independently visualized and interpreted imaging. I agree with the radiologist interpretation   Medicines ordered and prescription drug management: Meds ordered this encounter  Medications    lidocaine -EPINEPHrine -tetracaine  (LET) topical gel    -I have reviewed the patients home medicines and have made adjustments as needed   Cardiac Monitoring: The patient was maintained on a cardiac monitor.  I personally viewed and interpreted the cardiac monitored which showed an underlying rhythm of: Normal sinus rhythm  Social Determinants of Health:  Factors impacting patients care include: Multiple comorbidities including epilepsy, microcephaly, developmental delay   Reevaluation: After the interventions noted above, I reevaluated the patient and found that they have :improved  Co morbidities that complicate the patient evaluation  Past Medical History:  Diagnosis Date   Asthma    Autism    Back pain    Down syndrome    Memory loss    Scoliosis    Seizures (HCC)    Type 2 diabetes mellitus (HCC)    Vision abnormalities         Final Clinical Impression(s) / ED Diagnoses Final diagnoses:  Seizure disorder (HCC)  Laceration of forehead, initial encounter     @PCDICTATION @    Mannie Pac T, DO 12/08/24 1509     [1]  Social History Tobacco Use   Smoking status: Never    Passive exposure: Never   Smokeless tobacco: Never  Vaping Use   Vaping status: Never Used  Substance Use Topics   Alcohol use: Never   Drug use: Never     Mannie Pac T, DO 12/08/24 1534  "

## 2024-12-08 NOTE — Discharge Instructions (Addendum)
 While you were in the emergency room, you had blood work done that was normal.  Your CT scan was also normal.  We spoke with her neurologist who recommend that you follow-up with your neurologist given your history of epilepsy.  Please call them this week to let them know that you had a repeat seizure.  Continue to take all medications as prescribed.  The sutures in your forehead need to be removed in 1 week.  Follow-up with your primary care doctor or go to an urgent care.  You may gently wash area with soap and water, applying antibiotic ointment.

## 2024-12-09 ENCOUNTER — Emergency Department (HOSPITAL_COMMUNITY): Payer: MEDICAID

## 2024-12-09 ENCOUNTER — Encounter (HOSPITAL_COMMUNITY): Payer: Self-pay

## 2024-12-09 ENCOUNTER — Emergency Department (HOSPITAL_COMMUNITY)
Admission: EM | Admit: 2024-12-09 | Discharge: 2024-12-09 | Disposition: A | Discharge status: Home / Self Care | Payer: MEDICAID | Attending: Emergency Medicine | Admitting: Emergency Medicine

## 2024-12-09 ENCOUNTER — Telehealth (HOSPITAL_COMMUNITY): Payer: Self-pay

## 2024-12-09 ENCOUNTER — Other Ambulatory Visit: Payer: Self-pay

## 2024-12-09 DIAGNOSIS — S06360A Traumatic hemorrhage of cerebrum, unspecified, without loss of consciousness, initial encounter: Secondary | ICD-10-CM | POA: Diagnosis present

## 2024-12-09 DIAGNOSIS — I629 Nontraumatic intracranial hemorrhage, unspecified: Secondary | ICD-10-CM

## 2024-12-09 DIAGNOSIS — W01198A Fall on same level from slipping, tripping and stumbling with subsequent striking against other object, initial encounter: Secondary | ICD-10-CM | POA: Insufficient documentation

## 2024-12-09 LAB — CBC WITH DIFFERENTIAL/PLATELET
Abs Immature Granulocytes: 0.02 K/uL (ref 0.00–0.07)
Basophils Absolute: 0 K/uL (ref 0.0–0.1)
Basophils Relative: 1 %
Eosinophils Absolute: 0.1 K/uL (ref 0.0–0.5)
Eosinophils Relative: 1 %
HCT: 41.3 % (ref 39.0–52.0)
Hemoglobin: 13.8 g/dL (ref 13.0–17.0)
Immature Granulocytes: 0 %
Lymphocytes Relative: 39 %
Lymphs Abs: 3.4 K/uL (ref 0.7–4.0)
MCH: 30.9 pg (ref 26.0–34.0)
MCHC: 33.4 g/dL (ref 30.0–36.0)
MCV: 92.4 fL (ref 80.0–100.0)
Monocytes Absolute: 0.9 K/uL (ref 0.1–1.0)
Monocytes Relative: 10 %
Neutro Abs: 4.3 K/uL (ref 1.7–7.7)
Neutrophils Relative %: 49 %
Platelets: 264 K/uL (ref 150–400)
RBC: 4.47 MIL/uL (ref 4.22–5.81)
RDW: 12.8 % (ref 11.5–15.5)
WBC: 8.7 K/uL (ref 4.0–10.5)
nRBC: 0 % (ref 0.0–0.2)

## 2024-12-09 LAB — COMPREHENSIVE METABOLIC PANEL WITH GFR
ALT: 15 U/L (ref 0–44)
AST: 21 U/L (ref 15–41)
Albumin: 4.5 g/dL (ref 3.5–5.0)
Alkaline Phosphatase: 72 U/L (ref 38–126)
Anion gap: 11 (ref 5–15)
BUN: 14 mg/dL (ref 6–20)
CO2: 22 mmol/L (ref 22–32)
Calcium: 9 mg/dL (ref 8.9–10.3)
Chloride: 109 mmol/L (ref 98–111)
Creatinine, Ser: 1.11 mg/dL (ref 0.61–1.24)
GFR, Estimated: >60 mL/min
Glucose, Bld: 88 mg/dL (ref 70–99)
Potassium: 3.7 mmol/L (ref 3.5–5.1)
Sodium: 142 mmol/L (ref 135–145)
Total Bilirubin: 0.3 mg/dL (ref 0.0–1.2)
Total Protein: 7.5 g/dL (ref 6.5–8.1)

## 2024-12-09 NOTE — ED Provider Triage Note (Addendum)
 Emergency Medicine Provider Triage Evaluation Note  Stephen Shaw , a 24 y.o. male  was evaluated in triage.  Patient was seen here for seizure and a fall yesterday and discharged.  Was called by radiologist today who noted a possible bleed on CT head and to be seen here for further evaluation.  Patient denies any pain.  He denies any symptoms.  Denies headaches, weakness, dizziness, fever, chills, cough, shortness of breath, chest pain, numbness, tingling.  Taking his medication regularly.   Physical Exam  BP 126/80 (BP Location: Right Arm)   Pulse 81   Temp 98 F (36.7 C) (Oral)   Resp 16   Ht 6' 1 (1.854 m)   Wt 101.6 kg   SpO2 100%   BMI 29.55 kg/m  Gen:   Awake, no distress   Resp:  Normal effort  MSK:   Moves extremities without difficulty  Other:  Neurovascularly intact, no focal deficit.  Alert and oriented x 4.  Gait normal.  Medical Decision Making  Medically screening exam initiated at 3:17 PM.  Appropriate orders placed.  Stephen Shaw was informed that the remainder of the evaluation will be completed by another provider, this initial triage assessment does not replace that evaluation, and the importance of remaining in the ED until their evaluation is complete.  CT head without contrast, CBC, CMP   Braxton Dubois, PA-C 12/09/24 1515    Braxton Dubois, PA-C 12/09/24 1517

## 2024-12-09 NOTE — ED Provider Notes (Signed)
 " [facility] Provider Note   CSN: [id number] Arrival date & time: 12/09/24  1447     Patient presents with: [name]   Stephen Shaw is a 24 y.o. male hx of down syndrome, seizure here with possible intra cranial hemorrhage. Patient had a seizure yesterday and hit his head. His CT was read as normal yesterday and re read by another radiologist and was thought to have small R frontal parenchymal hemorrhage.  Patient was told the patient repeat CT scan thinking that was stable. Patient denies any new falls or another seizure since yesterday    The history is provided by the patient.       Prior to Admission medications  Medication Sig Start Date End Date Taking? Authorizing Provider  albuterol  (PROVENTIL ) (2.5 MG/3ML) 0.083% nebulizer solution Take 2.5 mg by nebulization every 6 (six) hours as needed for wheezing or shortness of breath. For shortness of breath/wheezing.    [provider]  budesonide (PULMICORT) 1 MG/2ML nebulizer solution Take 1 mg by nebulization daily.    [provider]  CETIRIZINE HCL CHILDRENS ALRGY 1 MG/ML SOLN Take 10 mLs by mouth daily. 05/15/23   [provider]  clindamycin-benzoyl peroxide (BENZACLIN) gel Apply 1 application topically See admin instructions. Apply to affected areas of the face as directed in the evening/wash face 2 times a day 03/16/18   [provider]  cloBAZam  (ONFI ) 20 MG tablet Take 1 tablet (20 mg total) by mouth 2 (two) times daily. 09/27/24   Nabizadeh, Reza, MD  fluticasone (FLONASE) 50 MCG/ACT nasal spray Place 2 sprays into both nostrils in the morning.    [provider]  HEALTHYLAX 17 g packet Take 34 g by mouth every 3 (three) days.    [provider]  hydrOXYzine (ATARAX) 10 MG/5ML syrup Take 30 mg by mouth at bedtime. 03/16/18   [provider]  metFORMIN (GLUCOPHAGE) 500 MG tablet Take 500 mg by mouth 2 (two) times daily with a  meal. 05/01/23   [provider]  montelukast (SINGULAIR) 5 MG chewable tablet Chew 10 mg by mouth in the morning.    [provider]  naproxen (NAPROSYN) 375 MG tablet Take 375 mg by mouth 2 (two) times daily as needed (for pain). 07/23/21   [provider]  NAYZILAM  5 MG/0.1ML SOLN Apply 5 mg nasally for seizures lasting longer than 5 minutes 06/17/24   Henderly, Britni A, PA-C  ondansetron  (ZOFRAN  ODT) 4 MG disintegrating tablet Take 1 tablet (4 mg total) by mouth every 8 (eight) hours as needed for nausea or vomiting. 06/23/18   Peri Glendia ORN, MD  PROAIR  HFA 108 (90 Base) MCG/ACT inhaler Inhale 2 puffs into the lungs every 4 (four) hours as needed for wheezing or shortness of breath.    [provider]  SYMBICORT 160-4.5 MCG/ACT inhaler Inhale 2 puffs into the lungs in the morning. 03/16/18   [provider]  topiramate  (TOPAMAX ) 200 MG tablet Take 1.5 tablets or 300 mg twice daily 09/27/24   Corinthia Blossom, MD  VITAMIN D3 SUPER STRENGTH 50 MCG (2000 UT) CAPS Take 2,000 Units by mouth daily.    [provider]    Allergies: American cockroach, Other, and Timothy grass pollen allergen    Review of Systems  Neurological:  Positive for headaches.  All other systems reviewed and are negative.   Updated Vital Signs BP 126/80 (BP Location: Right Arm)   Pulse 81   Temp  98 F (36.7 C) (Oral)   Resp 16   Ht 6' 1 (1.854 m)   Wt 101.6 kg   SpO2 100%   BMI 29.55 kg/m   Physical Exam Vitals and nursing note reviewed.  HENT:     Head: Normocephalic.     Comments: Multiple stitches placed and wound intact     Nose: Nose normal.     Mouth/Throat:     Mouth: Mucous membranes are moist.  Eyes:     Extraocular Movements: Extraocular movements intact.     Pupils: Pupils are equal, round, and reactive to light.  Cardiovascular:     Rate and Rhythm: Normal rate and regular rhythm.     Pulses: Normal pulses.  Pulmonary:     Effort:  Pulmonary effort is normal.  Abdominal:     General: Abdomen is flat.  Musculoskeletal:        General: Normal range of motion.     Cervical back: Normal range of motion.  Skin:    General: Skin is warm.     Capillary Refill: Capillary refill takes less than 2 seconds.  Neurological:     General: No focal deficit present.     Mental Status: He is alert and oriented to person, place, and time.  Psychiatric:        Mood and Affect: Mood normal.        Behavior: Behavior normal.     (all labs ordered are listed, but only abnormal results are displayed) Labs Reviewed  CBC WITH DIFFERENTIAL/PLATELET  COMPREHENSIVE METABOLIC PANEL WITH GFR    EKG: None  Radiology: CT Head Wo Contrast Result Date: 12/09/2024 CLINICAL DATA:  Seizure possible head bleed EXAM: CT HEAD WITHOUT CONTRAST TECHNIQUE: Contiguous axial images were obtained from the base of the skull through the vertex without intravenous contrast. RADIATION DOSE REDUCTION: This exam was performed according to the departmental dose-optimization program which includes automated exposure control, adjustment of the mA and/or kV according to patient size and/or use of iterative reconstruction technique. COMPARISON:  CT brain 12/08/2024, 06/17/2024 FINDINGS: Brain: No mass effect or midline shift. Again visualized within the sub cortical right parasagittal frontal lobe is a small hyperdense focus measuring 6 mm maximum diameter, series 2, image 23 with surrounding mild hypodense edema. This is not significantly changed. The ventricles are nonenlarged. Vascular: No hyperdense vessel or unexpected calcification. Skull: Normal. Negative for fracture or focal lesion. Sinuses/Orbits: No acute finding. Other: None IMPRESSION: No significant change in the small 6 mm hyperdense focus within the subcortical right parasagittal frontal lobe with surrounding mild hypodense edema. No significant mass effect or midline shift. Findings are indeterminate for  small focus of hemorrhage versus small hyperdense mass. Suggest further evaluation with MRI with and without contrast. Electronically Signed   By: Luke Bun M.D.   On: 12/09/2024 16:07   CT Head Wo Contrast Addendum Date: 12/09/2024 ADDENDUM REPORT: 12/09/2024 13:32 ADDENDUM: On further review, there is a tiny acute parenchymal 6 mm focus of hemorrhage over the right frontal lobe. No associated mass effect. Findings called to the ER and discussed with Dr. Neysa at the time of addendum. Electronically Signed   By: Toribio Agreste M.D.   On: 12/09/2024 13:32   Result Date: 12/09/2024 CLINICAL DATA:  Seizure with fall/head trauma. EXAM: CT HEAD WITHOUT CONTRAST TECHNIQUE: Contiguous axial images were obtained from the base of the skull through the vertex without intravenous contrast. RADIATION DOSE REDUCTION: This exam was performed according to the departmental dose-optimization  program which includes automated exposure control, adjustment of the mA and/or kV according to patient size and/or use of iterative reconstruction technique. COMPARISON:  06/17/2024 FINDINGS: Brain: Ventricles, cisterns and other CSF spaces are normal. There is no mass, mass effect, shift of midline structures or acute hemorrhage. No evidence of acute infarction. Vascular: No hyperdense vessel or unexpected calcification. Skull: Normal. Negative for fracture or focal lesion. Sinuses/Orbits: Orbits are normal.  Paranasal sinuses are clear. Other: None. IMPRESSION: No acute findings. Electronically Signed: By: Toribio Agreste M.D. On: 12/08/2024 14:53     Procedures   Medications Ordered in the ED - No data to display                                  Medical Decision Making Stephen Shaw is a 24 y.o. male here presenting with possible head bleed on CT scan.  Repeat CT done here showed unchanged hyperdense lesion in the right frontal lobe.  Labs unremarkable.  Patient has nonfocal neuroexam.  Discussed with Dr. Darnella from  neurosurgery who states that patient is stable for discharge and can get outpatient MRI   Problems Addressed: Intracranial hemorrhage [facility]): acute illness or injury     Final diagnoses:  None    ED Discharge Orders     None          Patt Alm Macho, MD 12/09/24 1936  "

## 2024-12-09 NOTE — ED Triage Notes (Signed)
 Denies headaches, numbness, tingling.

## 2024-12-09 NOTE — ED Triage Notes (Signed)
 Pt reportedly had seizure yesterday and had w/u completed in ED. Pt had CT head w/o yesterday and was re-read by radiologist today which showed possible 6 mm bleed.

## 2024-12-09 NOTE — Progress Notes (Signed)
 24 y/o M w/ epilepsy who suffered a seizure and fall yesterday. CT head yesterday originally read as negative and he was sent home. CT was later addended to reflect a small frontal ICH. He was called back to the ED and got a second head CT 24 hours after the first which shows stability  Will plan for MRI brain w/wo contrast as an outpatient

## 2024-12-09 NOTE — Telephone Encounter (Signed)
 I received a phone call from a radiologist stating that patient discharged from the emergency department yesterday has a small parenchymal hemorrhage.  I did call patient to discuss these results and urged him to come back to the emergency department immediately for repeat evaluation and repeat CT scan.  He is going to try to come back in today.  He denies really any sort of symptoms at this time however.

## 2024-12-09 NOTE — Discharge Instructions (Signed)
 Your CT scan is stable  Please call Dr. Darnella office to schedule appointment   Suture removal in a week   Return to ER if you have worse headache or dizziness or seizure

## 2024-12-10 ENCOUNTER — Telehealth (INDEPENDENT_AMBULATORY_CARE_PROVIDER_SITE_OTHER): Payer: Self-pay | Admitting: Neurology

## 2024-12-10 LAB — TOPIRAMATE LEVEL: Topiramate Lvl: 13.8 ug/mL (ref 2.0–25.0)

## 2024-12-10 NOTE — Telephone Encounter (Signed)
 Dad called and stated [place] had a seizure, he fell hit his head so hard that he had to get 8 stitches and bleeding of the brain. He had a cat scan yesterday and he may need another appointment depending o the ct scan results. He also has to get an MRI. They want dad to contact us  to see if he needs to go up on his medication and if he need a sooner appointment. I let dad know that I will send message over to Dr. Jenney and will call him with his advise.  Dad understood message

## 2024-12-10 NOTE — Telephone Encounter (Signed)
"  °  Name of who is calling: Kiki Lesches   Caller's Relationship to Patient: dad   Best contact number: [phone]  Provider they see: Dr. Jenney   Reason for call: Dad stated his son was coming out of the bathroom and had a seizure. He fell and bumped head on the ground and had to get 8 stitches. He is requesting to talk to someone regarding this incident.      PRESCRIPTION REFILL ONLY  Name of prescription:  Pharmacy:   "

## 2025-04-04 ENCOUNTER — Ambulatory Visit (INDEPENDENT_AMBULATORY_CARE_PROVIDER_SITE_OTHER): Payer: Self-pay | Admitting: Neurology
